# Patient Record
Sex: Male | Born: 1960 | Race: White | Hispanic: No | Marital: Married | Smoking: Never smoker
Health system: Southern US, Community
[De-identification: ages and names within clinical notes are randomized; demographics above are authoritative.]

## PROBLEM LIST (undated history)

## (undated) DIAGNOSIS — F329 Major depressive disorder, single episode, unspecified: Secondary | ICD-10-CM

## (undated) DIAGNOSIS — E7800 Pure hypercholesterolemia, unspecified: Secondary | ICD-10-CM

## (undated) HISTORY — PX: TONSILLECTOMY: SUR1361

## (undated) HISTORY — DX: Major depressive disorder, single episode, unspecified: F32.9

## (undated) HISTORY — DX: Pure hypercholesterolemia, unspecified: E78.00

---

## 2010-08-10 ENCOUNTER — Ambulatory Visit: Payer: Self-pay | Admitting: Cardiology

## 2010-08-17 ENCOUNTER — Ambulatory Visit: Payer: Self-pay | Admitting: Cardiology

## 2011-01-04 ENCOUNTER — Encounter: Payer: Self-pay | Admitting: Cardiology

## 2011-01-04 DIAGNOSIS — E7800 Pure hypercholesterolemia, unspecified: Secondary | ICD-10-CM | POA: Insufficient documentation

## 2011-01-04 DIAGNOSIS — E559 Vitamin D deficiency, unspecified: Secondary | ICD-10-CM | POA: Insufficient documentation

## 2011-02-28 ENCOUNTER — Encounter: Payer: Self-pay | Admitting: Cardiology

## 2011-02-28 NOTE — Progress Notes (Unsigned)
   Patient ID: Bruce [name], male    DOB: 05/12/1961, 50 y.o.   MRN: [id number]  HPI    Review of Systems  Constitutional: Negative.  Negative for fever, chills, diaphoresis, activity change, appetite change and fatigue.  HENT: Negative for neck pain.   Eyes: Negative for visual disturbance.  Respiratory: Negative for apnea, cough, chest tightness, shortness of breath and wheezing.   Cardiovascular: Negative.  Negative for chest pain, palpitations and leg swelling.  Gastrointestinal: Negative for nausea, abdominal pain, diarrhea, constipation and blood in stool.  Genitourinary: Negative for dysuria and frequency.  Musculoskeletal: Negative for myalgias, back pain, joint swelling, arthralgias and gait problem.  Skin: Negative for color change, pallor and rash.  Neurological: Negative for dizziness, syncope, weakness, light-headedness, numbness and headaches.  Hematological: Does not bruise/bleed easily.  Psychiatric/Behavioral: Negative for confusion and sleep disturbance. The patient is not nervous/anxious.       Physical Exam  Constitutional: He appears well-developed. No distress.  HENT:  Head: Normocephalic.  Mouth/Throat: Oropharynx is clear and moist.  Eyes: Conjunctivae and EOM are normal. Pupils are equal, round, and reactive to light.  Neck: No JVD present. Carotid bruit is not present. No mass and no thyromegaly present.  Cardiovascular: Regular rhythm, normal heart sounds and normal pulses.  PMI is not displaced.  Exam reveals no gallop.   No murmur heard. Pulmonary/Chest: Effort normal and breath sounds normal.  Abdominal: Soft. Bowel sounds are normal. There is no hepatosplenomegaly. There is no tenderness.

## 2011-02-28 NOTE — Progress Notes (Deleted)
HPI  Allergies no known allergies  Current Outpatient Prescriptions on File Prior to Visit  Medication Sig Dispense Refill  . Cholecalciferol (VITAMIN D) 2000 UNIT CAPS Take by mouth.        . fish oil-omega-3 fatty acids 1000 MG capsule Take 2 g by mouth daily.        [name] omega-3 acid ethyl esters (LOVAZA) 1 GM capsule Take 2 g by mouth daily.        . pravastatin (PRAVACHOL) 20 MG tablet Take 20 mg by mouth daily.          Past Medical History  Diagnosis Date  . Hypercholesteremia   . Vitamin D deficiency     Past Surgical History  Procedure Date  . Tonsillectomy     Family History  Problem Relation Age of Onset  . Heart attack Father     History   Social History  . Marital Status: Legally Separated    Spouse Name: N/A    Number of Children: N/A  . Years of Education: N/A   Occupational History  . Not on file.   Social History Main Topics  . Smoking status: Never Smoker   . Smokeless tobacco: Not on file  . Alcohol Use: No  . Drug Use:   . Sexually Active:    Other Topics Concern  . Not on file   Social History Narrative  . No narrative on file    ROS   PHYSICAL EXAM There were no vitals taken for this visit.  ASSESSMENT AND PLAN   This encounter was created in error - please disregard.

## 2011-03-02 NOTE — Progress Notes (Signed)
error 

## 2011-05-18 ENCOUNTER — Other Ambulatory Visit: Payer: Self-pay | Admitting: Cardiovascular Disease

## 2011-05-21 ENCOUNTER — Other Ambulatory Visit: Payer: Self-pay | Admitting: *Deleted

## 2011-05-21 DIAGNOSIS — E785 Hyperlipidemia, unspecified: Secondary | ICD-10-CM

## 2011-05-21 MED ORDER — PRAVASTATIN SODIUM 20 MG PO TABS: 20.0000 mg | ORAL_TABLET | Freq: Every day | ORAL | Status: DC

## 2011-05-21 NOTE — Telephone Encounter (Signed)
Fax received from pharmacy. Refill completed 60 days. Unable to leave msg for pt left msg on script lable for fasting labs and ov.Alfonso Ramus RN

## 2011-05-22 ENCOUNTER — Encounter: Payer: Self-pay | Admitting: Cardiology

## 2011-06-27 ENCOUNTER — Ambulatory Visit: Payer: Self-pay | Admitting: Cardiology

## 2011-06-27 ENCOUNTER — Other Ambulatory Visit: Payer: Self-pay | Admitting: *Deleted

## 2011-08-19 ENCOUNTER — Other Ambulatory Visit: Payer: Self-pay | Admitting: Cardiovascular Disease

## 2011-08-20 ENCOUNTER — Other Ambulatory Visit: Payer: Self-pay | Admitting: *Deleted

## 2011-08-20 NOTE — Telephone Encounter (Signed)
escribe medication per fax request  

## 2011-08-21 ENCOUNTER — Ambulatory Visit: Payer: Self-pay | Admitting: Cardiology

## 2011-08-21 ENCOUNTER — Other Ambulatory Visit: Payer: Self-pay | Admitting: *Deleted

## 2011-10-04 ENCOUNTER — Other Ambulatory Visit: Payer: Self-pay | Admitting: Cardiovascular Disease

## 2011-10-05 ENCOUNTER — Other Ambulatory Visit: Payer: Self-pay | Admitting: Cardiovascular Disease

## 2011-10-08 ENCOUNTER — Other Ambulatory Visit: Payer: Self-pay | Admitting: Cardiology

## 2011-10-08 MED ORDER — OMEGA-3-ACID ETHYL ESTERS 1 G PO CAPS: ORAL_CAPSULE | ORAL | Status: DC

## 2011-10-08 NOTE — Telephone Encounter (Signed)
Called requesting refill on Lovaza. Has an app in Dec but doesn't have enough meds until then. Sent refill in to [facility]

## 2011-10-08 NOTE — Telephone Encounter (Signed)
Pt had called about lovaza He said that he only got enough for 30 days, but he has appt on dec 20 to see dr [place]. He takes 2 a day and wont have enough to last until then please call him back

## 2011-11-23 ENCOUNTER — Other Ambulatory Visit (INDEPENDENT_AMBULATORY_CARE_PROVIDER_SITE_OTHER): Payer: BC Managed Care – PPO | Admitting: *Deleted

## 2011-11-23 DIAGNOSIS — E785 Hyperlipidemia, unspecified: Secondary | ICD-10-CM

## 2011-11-23 LAB — HEPATIC FUNCTION PANEL
ALT: 32 U/L (ref 0–53)
Alkaline Phosphatase: 49 U/L (ref 39–117)
Bilirubin, Direct: 0.2 mg/dL (ref 0.0–0.3)
Total Bilirubin: 1 mg/dL (ref 0.3–1.2)

## 2011-11-23 LAB — LIPID PANEL: VLDL: 27.6 mg/dL (ref 0.0–40.0)

## 2011-11-23 LAB — BASIC METABOLIC PANEL
BUN: 22 mg/dL (ref 6–23)
Creatinine, Ser: 1.1 mg/dL (ref 0.4–1.5)
GFR: 77.1 mL/min (ref >60.00)
Glucose, Bld: 101 mg/dL — ABNORMAL HIGH (ref 70–99)
Potassium: 4.3 mEq/L (ref 3.5–5.1)

## 2011-11-26 ENCOUNTER — Other Ambulatory Visit: Payer: Self-pay | Admitting: *Deleted

## 2011-11-29 ENCOUNTER — Encounter: Payer: Self-pay | Admitting: Cardiology

## 2011-11-29 ENCOUNTER — Other Ambulatory Visit: Payer: Self-pay | Admitting: *Deleted

## 2011-11-29 ENCOUNTER — Ambulatory Visit (INDEPENDENT_AMBULATORY_CARE_PROVIDER_SITE_OTHER): Payer: BC Managed Care – PPO | Admitting: Cardiology

## 2011-11-29 VITALS — BP 118/82 | HR 70 | Ht 71.0 in | Wt 189.8 lb

## 2011-11-29 DIAGNOSIS — E785 Hyperlipidemia, unspecified: Secondary | ICD-10-CM

## 2011-11-29 DIAGNOSIS — E7800 Pure hypercholesterolemia, unspecified: Secondary | ICD-10-CM

## 2011-11-29 MED ORDER — PRAVASTATIN SODIUM 20 MG PO TABS: 20.0000 mg | ORAL_TABLET | Freq: Every day | ORAL | Status: DC

## 2011-11-29 MED ORDER — OMEGA-3-ACID ETHYL ESTERS 1 G PO CAPS: ORAL_CAPSULE | ORAL | Status: DC

## 2011-11-29 NOTE — Patient Instructions (Signed)
You need to increase your aerobic activity.  Continue your current medication.  I will see you again in 1 year with fasting lab work.

## 2011-11-29 NOTE — Assessment & Plan Note (Signed)
His lipid levels are improved. He will remain on his current therapy. I've encouraged him to increase his aerobic activity and lose weight. I'll followup again in one year with fasting lab work. Prescriptions were refilled today.

## 2011-11-29 NOTE — Progress Notes (Signed)
   [name] Date of Birth: [birth year] Medical Record #[id number]  History of Present Illness: Bruce [name] is seen for yearly followup. He reports that he has been doing well this year. He denies any new medical problems. He is on the same medications. He admits that he is not exercising. His weight has increased about 12 pounds. He has a history of hypercholesterolemia. He also is a family history of early coronary disease. He denies any chest pain or shortness of breath.  Current Outpatient Prescriptions on File Prior to Visit  Medication Sig Dispense Refill  . Cholecalciferol (VITAMIN D) 2000 UNIT CAPS Take by mouth.          No Known Allergies  Past Medical History  Diagnosis Date  . Hypercholesteremia   . Vitamin D deficiency     Past Surgical History  Procedure Date  . Tonsillectomy     History  Smoking status  . Never Smoker   Smokeless tobacco  . Not on file    History  Alcohol Use No    Family History  Problem Relation Age of Onset  . Heart attack Father     Review of Systems: As noted in history of present illness.  All other systems were reviewed and are negative.  Physical Exam: BP 118/82  Pulse 70  Ht 5\' 11"  (1.803 m)  Wt 189 lb 12.8 oz (86.093 kg)  BMI 26.47 kg/m2 The patient is alert and oriented x 3.  The mood and affect are normal.  The skin is warm and dry.  Color is normal.  The HEENT exam reveals that the sclera are nonicteric.  The mucous membranes are moist.  The carotids are 2+ without bruits.  There is no thyromegaly.  There is no JVD.  The lungs are clear.  The chest wall is non tender.  The heart exam reveals a regular rate with a normal S1 and S2.  There are no murmurs, gallops, or rubs.  The PMI is not displaced.   Abdominal exam reveals good bowel sounds.  There is no guarding or rebound.  There is no hepatosplenomegaly or tenderness.  There are no masses.  Exam of the legs reveal no clubbing, cyanosis, or edema.  The legs are without rashes.   The distal pulses are intact.  Cranial nerves II - XII are intact.  Motor and sensory functions are intact.  The gait is normal.  LABORATORY DATA: Lab Results  Component Value Date   GLUCOSE 101* 11/23/2011   CHOL 156 11/23/2011   TRIG 138.0 11/23/2011   HDL 44.70 11/23/2011   LDLCALC 84 11/23/2011   ALT 32 11/23/2011   AST 33 11/23/2011   NA 141 11/23/2011   K 4.3 11/23/2011   CL 105 11/23/2011   CREATININE 1.1 11/23/2011   BUN 22 11/23/2011   CO2 29 11/23/2011   Lab Results  Component Value Date   CHOL 156 11/23/2011   HDL 44.70 11/23/2011   LDLCALC 84 11/23/2011   TRIG 138.0 11/23/2011   CHOLHDL 3 11/23/2011   ECG today is normal.  Assessment / Plan:

## 2011-12-03 ENCOUNTER — Other Ambulatory Visit: Payer: Self-pay | Admitting: Cardiovascular Disease

## 2012-11-12 ENCOUNTER — Encounter: Payer: Self-pay | Admitting: Cardiology

## 2012-11-12 ENCOUNTER — Ambulatory Visit (INDEPENDENT_AMBULATORY_CARE_PROVIDER_SITE_OTHER): Payer: BC Managed Care – PPO | Admitting: Cardiology

## 2012-11-12 ENCOUNTER — Other Ambulatory Visit (INDEPENDENT_AMBULATORY_CARE_PROVIDER_SITE_OTHER): Payer: BC Managed Care – PPO

## 2012-11-12 VITALS — BP 137/91 | HR 102 | Ht 71.0 in | Wt 189.1 lb

## 2012-11-12 DIAGNOSIS — E7800 Pure hypercholesterolemia, unspecified: Secondary | ICD-10-CM

## 2012-11-12 DIAGNOSIS — E785 Hyperlipidemia, unspecified: Secondary | ICD-10-CM

## 2012-11-12 LAB — BASIC METABOLIC PANEL
Chloride: 100 mEq/L (ref 96–112)
Creatinine, Ser: 1 mg/dL (ref 0.4–1.5)
Potassium: 4.2 mEq/L (ref 3.5–5.1)

## 2012-11-12 LAB — HEPATIC FUNCTION PANEL
ALT: 34 U/L (ref 0–53)
Bilirubin, Direct: 0.1 mg/dL (ref 0.0–0.3)
Total Bilirubin: 0.7 mg/dL (ref 0.3–1.2)

## 2012-11-12 LAB — LIPID PANEL
Cholesterol: 228 mg/dL — ABNORMAL HIGH (ref 0–200)
Total CHOL/HDL Ratio: 6
VLDL: 63 mg/dL — ABNORMAL HIGH (ref 0.0–40.0)

## 2012-11-12 NOTE — Progress Notes (Signed)
Bruce [name] Date of Birth: 1961-02-03 Medical Record #[id number]  History of Present Illness: Bruce [name] is seen for yearly followup of hypercholesterolemia. He had previously been on pravastatin. He states he was having a lot of complaints of extreme fatigue at the end of the day. He was also awakening at night with his thoughts racing and was unable to get back to sleep. He had periods of feeling overwhelmed when he was in stressful situations at the office. He had decreased libido. He stop taking his pravastatin and he states that the symptoms are better. He still has some fatigue but it is improved. He denies any chest pain or shortness of breath.  No current outpatient prescriptions on file prior to visit.    No Known Allergies  Past Medical History  Diagnosis Date  . Hypercholesteremia   . Vitamin D deficiency     Past Surgical History  Procedure Date  . Tonsillectomy     History  Smoking status  . Never Smoker   Smokeless tobacco  . Not on file    History  Alcohol Use No    Family History  Problem Relation Age of Onset  . Heart attack Father     Review of Systems: As noted in history of present illness.  All other systems were reviewed and are negative.  Physical Exam: BP 137/91  Pulse 102  Ht 5\' 11"  (1.803 m)  Wt 189 lb 1.9 oz (85.784 kg)  BMI 26.38 kg/m2 The patient is alert and oriented x 3.  The mood and affect are normal.  The skin is warm and dry.    The HEENT exam is normal. The carotids are 2+ without bruits.  There is no thyromegaly.  There is no JVD.  The lungs are clear.  The heart exam reveals a regular rate with a normal S1 and S2.  There are no murmurs, gallops, or rubs.  The PMI is not displaced.   Abdominal exam reveals good bowel sounds.  There is no guarding or rebound.  There is no hepatosplenomegaly or tenderness.  There are no masses.  Exam of the legs reveal no clubbing, cyanosis, or edema.  The legs are without rashes.  The distal pulses are  intact.  Cranial nerves II - XII are intact.  Motor and sensory functions are intact.  The gait is normal.  LABORATORY DATA: Lab Results  Component Value Date   GLUCOSE 101* 11/12/2012   CHOL 228* 11/12/2012   TRIG 315.0* 11/12/2012   HDL 39.10 11/12/2012   LDLDIRECT 135.0 11/12/2012   LDLCALC 84 11/23/2011   ALT 34 11/12/2012   AST 33 11/12/2012   NA 138 11/12/2012   K 4.2 11/12/2012   CL 100 11/12/2012   CREATININE 1.0 11/12/2012   BUN 20 11/12/2012   CO2 30 11/12/2012   Lab Results  Component Value Date   CHOL 228* 11/12/2012   HDL 39.10 11/12/2012   LDLCALC 84 11/23/2011   LDLDIRECT 135.0 11/12/2012   TRIG 315.0* 11/12/2012   CHOLHDL 6 11/12/2012     Assessment / Plan: 1. Hypercholesterolemia. Patient is being treated for primary prevention. He has no known history of vascular disease. He has no other risk factors other than family history of coronary disease. Given his apparent reaction to pravastatin I would recommend lifestyle modifications including weight loss and regular aerobic exercise along with a Mediterranean diet. I would not place him back on statin therapy at this point. I'll followup again in one year.

## 2012-11-12 NOTE — Patient Instructions (Addendum)
We will call with the results of your lab work today.

## 2013-02-16 ENCOUNTER — Other Ambulatory Visit: Payer: Self-pay | Admitting: *Deleted

## 2013-02-17 ENCOUNTER — Other Ambulatory Visit: Payer: Self-pay | Admitting: Cardiology

## 2013-02-19 NOTE — Telephone Encounter (Signed)
Spoke to patient he stated he takes Lovaza 1 Gm 2 tablets every day.Refill sent to pharmacy.

## 2014-02-26 ENCOUNTER — Ambulatory Visit: Payer: BC Managed Care – PPO | Admitting: Cardiology

## 2014-06-04 ENCOUNTER — Ambulatory Visit: Payer: BC Managed Care – PPO | Admitting: Cardiology

## 2014-09-01 ENCOUNTER — Telehealth: Payer: Self-pay | Admitting: Cardiology

## 2014-09-01 DIAGNOSIS — E7800 Pure hypercholesterolemia, unspecified: Secondary | ICD-10-CM

## 2014-09-01 NOTE — Telephone Encounter (Signed)
New message          Pt would like to know if he needs blood work before his f/u visit

## 2014-09-03 NOTE — Telephone Encounter (Signed)
Returned call to patient may have fasting lab before appointment.Lab order mailed to patient.

## 2014-11-02 ENCOUNTER — Ambulatory Visit: Payer: BC Managed Care – PPO | Admitting: Cardiology

## 2015-11-25 ENCOUNTER — Telehealth: Payer: Self-pay | Admitting: Family Medicine

## 2015-11-25 NOTE — Telephone Encounter (Signed)
Pts wife called in stating they would prefer to see Dr. Laury AxonLowne. She is going to discuss options with the pt. He is changing over from another doctor who is just not happy with. They prefer to take a more holistic approach to treat a potential thyroid issue and move on to next steps. She will call after speaking with the pt.

## 2015-11-25 NOTE — Telephone Encounter (Signed)
REceived call from pts mother stating that he is always cold and doesn't sleep well, she feels concern of thyroid issue, offered to schedule acute appt with Cody (30 min) for next week, pt mother said to please call him, called pt primary contact # and left msg for him to call and schedule appt if he would like (new pt in March 2017 with Dr. Laury AxonLowne)

## 2015-11-28 ENCOUNTER — Telehealth: Payer: Self-pay | Admitting: Family Medicine

## 2015-11-28 ENCOUNTER — Encounter: Payer: Self-pay | Admitting: Medical

## 2015-11-28 ENCOUNTER — Ambulatory Visit (INDEPENDENT_AMBULATORY_CARE_PROVIDER_SITE_OTHER): Payer: BLUE CROSS/BLUE SHIELD | Admitting: Medical

## 2015-11-28 VITALS — BP 118/80 | HR 77 | Temp 97.6°F | Ht 71.0 in | Wt 187.2 lb

## 2015-11-28 DIAGNOSIS — R6882 Decreased libido: Secondary | ICD-10-CM

## 2015-11-28 DIAGNOSIS — N529 Male erectile dysfunction, unspecified: Secondary | ICD-10-CM | POA: Insufficient documentation

## 2015-11-28 DIAGNOSIS — R0683 Snoring: Secondary | ICD-10-CM | POA: Insufficient documentation

## 2015-11-28 DIAGNOSIS — R946 Abnormal results of thyroid function studies: Secondary | ICD-10-CM

## 2015-11-28 DIAGNOSIS — E559 Vitamin D deficiency, unspecified: Secondary | ICD-10-CM

## 2015-11-28 DIAGNOSIS — E785 Hyperlipidemia, unspecified: Secondary | ICD-10-CM

## 2015-11-28 DIAGNOSIS — R5383 Other fatigue: Secondary | ICD-10-CM

## 2015-11-28 DIAGNOSIS — E7800 Pure hypercholesterolemia, unspecified: Secondary | ICD-10-CM

## 2015-11-28 DIAGNOSIS — R7989 Other specified abnormal findings of blood chemistry: Secondary | ICD-10-CM

## 2015-11-28 DIAGNOSIS — F411 Generalized anxiety disorder: Secondary | ICD-10-CM

## 2015-11-28 NOTE — Progress Notes (Signed)
Subjective:    Patient ID: Bruce [name], male    DOB: [birth year], 54 y.o.   MRN: [id number]  HPI  I have reviewed pt PMH, PSH, FH, Social History and Surgical History  Pt in past had some borderline high  lipids in past. When he sticks to low lipid diet and exercise numbers are more controlled.  Hx of low vitamin d deficiency in the past. Never took rx vitamin  D.  Pt is an Art gallery managerengineer, no exercise, recent healthy diet, 1 cup coffee a day. Married- 4 children.  Pt feels fatigue. He states has been gradual decrease in energy for 5 years. He states fatigue worst in the evening. Also he has had gradually decreasing libido in the past. Work up  About 5 years work in the past showed low vitamin D. Pt never took vitamin D and never got back to normal. He never took the vitamin D.   He also states he feels cold all the time. This getting cold aspect started 4-5 months ago.  Pt has tinnitus comes and goes. No hearing problems. No ha and no dizziness reported.  Pt states he wakes up early in morning at times. Trouble staying asleep. When he wakes up he has some anxiety and racing thoughts.  Anxiety starts around 4:30 am when he awakes. By early afternoon anxiety resolved. Recently pessimstic and not motivated. Feels maybe depressed little bit. Appetite decreased.  Pt did have some labs tsh ws normal. TPO was little high.  Prior MD thought maybe sleep study since he snores. Also recommended antidepressant.        Review of Systems  Constitutional: Positive for fatigue. Negative for fever and chills.  HENT: Positive for tinnitus. Negative for congestion, ear discharge, facial swelling, postnasal drip, rhinorrhea, sinus pressure and sneezing.   Respiratory: Negative for apnea.   Cardiovascular: Negative for chest pain and palpitations.  Endocrine: Positive for cold intolerance.  Skin: Negative for rash.  Neurological: Negative for dizziness and headaches.  Psychiatric/Behavioral:  Positive for sleep disturbance and dysphoric mood. Negative for behavioral problems and confusion. The patient is nervous/anxious.        See hpi.    Past Medical History  Diagnosis Date  . Hypercholesteremia   . Vitamin D deficiency     Social History   Social History  . Marital Status: Married    Spouse Name: N/A  . Number of Children: 4  . Years of Education: N/A   Occupational History  . engineer    Social History Main Topics  . Smoking status: Never Smoker   . Smokeless tobacco: Not on file  . Alcohol Use: No  . Drug Use: No  . Sexual Activity: Yes   Other Topics Concern  . Not on file   Social History Narrative    Past Surgical History  Procedure Laterality Date  . Tonsillectomy      Family History  Problem Relation Age of Onset  . Heart attack Father   . Hypothyroidism Mother     No Known Allergies  No current outpatient prescriptions on file prior to visit.   No current facility-administered medications on file prior to visit.    BP 118/80 mmHg  Pulse 77  Temp(Src) 97.6 F (36.4 C) (Oral)  Ht 5\' 11"  (1.803 m)  Wt 187 lb 3.2 oz (84.913 kg)  BMI 26.12 kg/m2  SpO2 98%       Objective:   Physical Exam  General Mental Status- Alert. General Appearance- Not  in acute distress.   Skin General: Color- Normal Color. Moisture- Normal Moisture.  Neck Carotid Arteries- Normal color. Moisture- Normal Moisture.no thryromegaly.  Chest and Lung Exam Auscultation: Breath Sounds:-Normal.  Cardiovascular Auscultation:Rythm- Regular. Murmurs & Other Heart Sounds:Auscultation of the heart reveals- No Murmurs.  Abdomen Inspection:-Inspeection Normal. Palpation/Percussion:Note:No mass. Palpation and Percussion of the abdomen reveal- Non Tender, Non Distended + BS, no rebound or guarding.  Neurologic Cranial Nerve exam:- CN III-XII intact(No nystagmus), symmetric smile     Assessment & Plan:  For your  Fatigue and other signs  today, will  get lab tomorrow am.  I referred you to endocrinologist for your fatigue and elevated TPO studies.  I referred you to pulmonologist for snoring as this may effect your energy levels if O2 sats are dropping less than 90% while sleeping.  Consider use of antidepressant medication if labs and referrals are negative.  Follow up in 2-3 weeks or as needed

## 2015-11-28 NOTE — Progress Notes (Signed)
Pre visit review using our clinic review tool, if applicable. No additional management support is needed unless otherwise documented below in the visit note. 

## 2015-11-28 NOTE — Assessment & Plan Note (Signed)
Lipid panel check fasting tomorrow am.

## 2015-11-28 NOTE — Assessment & Plan Note (Signed)
Get testosterone tomorrow morning. If low tsh will plan to get psa/dre  in near future. But refer to endocrinologist for possible testosterone replacement.

## 2015-11-28 NOTE — Telephone Encounter (Signed)
At check out pt had another question for provider that he seen.   Per provider he will fu with pt at his earliest convenience .   CB: [phone]

## 2015-11-28 NOTE — Assessment & Plan Note (Signed)
Refer to evaluation and possible sleep study.

## 2015-11-28 NOTE — Assessment & Plan Note (Signed)
Labs tomorrow. Refer to endocrine for his high tpo test but normal tsh and t4.

## 2015-11-28 NOTE — Telephone Encounter (Signed)
Left message for pt to call back  °

## 2015-11-28 NOTE — Assessment & Plan Note (Signed)
Will get level tomorrow when in for labs.

## 2015-11-28 NOTE — Telephone Encounter (Signed)
I put in another referral with different diagnosis. Endocrine stated they would not see him for fatigue.

## 2015-11-28 NOTE — Assessment & Plan Note (Signed)
First time here. But I think he may have some low level chronic anxiety and mild depression. If lab work up for cause off fatigue would consider trial of effexor.

## 2015-11-28 NOTE — Telephone Encounter (Signed)
Pt wanted me to add thyroid studies. I will do that at his request. But I did not order this yesterday since he just had this done on 11-22-2015. Since so recent thought maybe unnecessary and did not want him to have issue with insurance. But I did place order in at his request. If you mention to him when he is in for lab in am.

## 2015-11-28 NOTE — Telephone Encounter (Signed)
Caller name: Self   Can be reached: [phone]   Reason for call: Mervyn SkeetersSaw Edward this morning and will be coming back tomorrow for labs. Wants to have Thyroid Panel added to the labs for tomorrow

## 2015-11-28 NOTE — Patient Instructions (Addendum)
For your  Fatigue and other symptoms today, will get lab tomorrow am.  I referred you to endocrinologist for your fatigue and elevated TPO studies.  I referred you to pulmonologist for snoring as this may effect your energy levels if O2 sats are dropping less than 90% while sleeping.  Consider use of antidepressant medication if labs and referrals are negative.  Follow up in 2-3 weeks or as needed

## 2015-11-28 NOTE — Telephone Encounter (Signed)
At visit pt gave provider Ambulance person(Saguier) some labs results  [facility].   Made copies for provider and forwarded to CMA for paperwork.

## 2015-11-29 ENCOUNTER — Other Ambulatory Visit (INDEPENDENT_AMBULATORY_CARE_PROVIDER_SITE_OTHER): Payer: BLUE CROSS/BLUE SHIELD

## 2015-11-29 DIAGNOSIS — E785 Hyperlipidemia, unspecified: Secondary | ICD-10-CM | POA: Diagnosis not present

## 2015-11-29 DIAGNOSIS — R6882 Decreased libido: Secondary | ICD-10-CM

## 2015-11-29 DIAGNOSIS — R5383 Other fatigue: Secondary | ICD-10-CM

## 2015-11-29 DIAGNOSIS — R7989 Other specified abnormal findings of blood chemistry: Secondary | ICD-10-CM

## 2015-11-29 LAB — T4, FREE: FREE T4: 0.72 ng/dL (ref 0.60–1.60)

## 2015-11-29 LAB — CBC WITH DIFFERENTIAL/PLATELET
BASOS PCT: 0.6 % (ref 0.0–3.0)
Basophils Absolute: 0 10*3/uL (ref 0.0–0.1)
EOS ABS: 0.2 10*3/uL (ref 0.0–0.7)
Eosinophils Relative: 2.6 % (ref 0.0–5.0)
HCT: 45.7 % (ref 39.0–52.0)
HEMOGLOBIN: 15 g/dL (ref 13.0–17.0)
LYMPHS ABS: 2.4 10*3/uL (ref 0.7–4.0)
Lymphocytes Relative: 36.4 % (ref 12.0–46.0)
MCHC: 32.8 g/dL (ref 30.0–36.0)
MCV: 74.2 fl — ABNORMAL LOW (ref 78.0–100.0)
MONO ABS: 0.5 10*3/uL (ref 0.1–1.0)
Monocytes Relative: 7.3 % (ref 3.0–12.0)
NEUTROS PCT: 53.1 % (ref 43.0–77.0)
Neutro Abs: 3.5 10*3/uL (ref 1.4–7.7)
PLATELETS: 272 10*3/uL (ref 150.0–400.0)
RBC: 6.15 Mil/uL — ABNORMAL HIGH (ref 4.22–5.81)
RDW: 14 % (ref 11.5–15.5)
WBC: 6.7 10*3/uL (ref 4.0–10.5)

## 2015-11-29 LAB — COMPREHENSIVE METABOLIC PANEL
ALT: 19 U/L (ref 0–53)
AST: 21 U/L (ref 0–37)
Albumin: 4.7 g/dL (ref 3.5–5.2)
Alkaline Phosphatase: 50 U/L (ref 39–117)
BUN: 21 mg/dL (ref 6–23)
CHLORIDE: 102 meq/L (ref 96–112)
CO2: 30 meq/L (ref 19–32)
CREATININE: 1.03 mg/dL (ref 0.40–1.50)
Calcium: 9.9 mg/dL (ref 8.4–10.5)
GFR: 80.15 mL/min (ref >60.00)
GLUCOSE: 102 mg/dL — AB (ref 70–99)
Potassium: 4 mEq/L (ref 3.5–5.1)
SODIUM: 140 meq/L (ref 135–145)
Total Bilirubin: 0.8 mg/dL (ref 0.2–1.2)
Total Protein: 7.5 g/dL (ref 6.0–8.3)

## 2015-11-29 LAB — LIPID PANEL
CHOL/HDL RATIO: 6
CHOLESTEROL: 232 mg/dL — AB (ref 0–200)
HDL: 42.1 mg/dL (ref >39.00)
NonHDL: 189.53
TRIGLYCERIDES: 220 mg/dL — AB (ref 0.0–149.0)
VLDL: 44 mg/dL — AB (ref 0.0–40.0)

## 2015-11-29 LAB — LDL CHOLESTEROL, DIRECT: LDL DIRECT: 146 mg/dL

## 2015-11-29 LAB — VITAMIN D 25 HYDROXY (VIT D DEFICIENCY, FRACTURES): VITD: 20.39 ng/mL — ABNORMAL LOW (ref 30.00–100.00)

## 2015-11-29 LAB — TSH: TSH: 2.54 u[IU]/mL (ref 0.35–4.50)

## 2015-11-30 ENCOUNTER — Telehealth: Payer: Self-pay | Admitting: Medical

## 2015-11-30 DIAGNOSIS — Z125 Encounter for screening for malignant neoplasm of prostate: Secondary | ICD-10-CM

## 2015-11-30 LAB — TESTOSTERONE, FREE, TOTAL, SHBG
Sex Hormone Binding: 18 nmol/L (ref 10–50)
Testosterone, Free: 68.4 pg/mL (ref 47.0–244.0)
Testosterone-% Free: 2.6 % (ref 1.6–2.9)
Testosterone: 260 ng/dL — ABNORMAL LOW (ref 300–890)

## 2015-11-30 LAB — THYROID PEROXIDASE ANTIBODY: THYROID PEROXIDASE ANTIBODY: 80 [IU]/mL — AB (ref <9)

## 2015-11-30 NOTE — Telephone Encounter (Signed)
Would you mind asking why? So I can explain to patient? Have we had any success in referring to other office. Has this pt been seen by this office before?

## 2015-11-30 NOTE — Telephone Encounter (Signed)
Note sent to Wadie Lessenavid Reabold in lab to see if we could add psa to test done other day.

## 2015-12-01 NOTE — Telephone Encounter (Signed)
Are you speaking of endocrinology referral? If so patient was declined due to dx

## 2015-12-02 ENCOUNTER — Telehealth: Payer: Self-pay | Admitting: Medical

## 2015-12-02 DIAGNOSIS — R7989 Other specified abnormal findings of blood chemistry: Secondary | ICD-10-CM

## 2015-12-02 MED ORDER — VITAMIN D (ERGOCALCIFEROL) 1.25 MG (50000 UNIT) PO CAPS: 50000.0000 [IU] | ORAL_CAPSULE | ORAL | Status: DC

## 2015-12-02 NOTE — Telephone Encounter (Signed)
Called phone number [phone]. Left message that I want to talk with him directly. Will try to call around 5:30 pm.

## 2015-12-02 NOTE — Telephone Encounter (Signed)
I advised pt on tpo, tsh and t4 levels. That I am trying to get him in with endocrinologist Dr. Talmage NapBalan. Advised on low vitamin d. He is willing to take rx vitamin d and get repeat level in 8 wks.  Advised on lipid. Made aware markers high. He states pravastatin put him in mental fog. He want to try strict diet, exercise and fishoil. Will repeat lipid panel in 3 months fasting.  Advised pt on low testosterone. Advised on beneftit vs risk. He want to get endocrinologist opinion.   Asked him to follow up in 6 wks hopefully afer we get endocrine opinion.

## 2015-12-02 NOTE — Telephone Encounter (Signed)
Reminder on Dr. Talmage NapBalan. On know you put up your reminder. But thought I would send one as well.

## 2015-12-06 ENCOUNTER — Telehealth: Payer: Self-pay | Admitting: Medical

## 2015-12-06 NOTE — Telephone Encounter (Signed)
psa added to labs but not done. Intend to order that on follow up when he is in office.

## 2015-12-07 NOTE — Telephone Encounter (Signed)
Records were faxed to Dr Talmage NapBalan on 12/01/15, awaiting appt

## 2015-12-20 ENCOUNTER — Ambulatory Visit: Payer: BLUE CROSS/BLUE SHIELD | Admitting: Medical

## 2015-12-27 ENCOUNTER — Encounter: Payer: Self-pay | Admitting: Medical

## 2015-12-27 ENCOUNTER — Ambulatory Visit (INDEPENDENT_AMBULATORY_CARE_PROVIDER_SITE_OTHER): Payer: BLUE CROSS/BLUE SHIELD | Admitting: Medical

## 2015-12-27 VITALS — BP 110/76 | HR 60 | Temp 97.7°F | Ht 71.0 in | Wt 182.2 lb

## 2015-12-27 DIAGNOSIS — E039 Hypothyroidism, unspecified: Secondary | ICD-10-CM | POA: Diagnosis not present

## 2015-12-27 DIAGNOSIS — R7989 Other specified abnormal findings of blood chemistry: Secondary | ICD-10-CM | POA: Diagnosis not present

## 2015-12-27 DIAGNOSIS — Z125 Encounter for screening for malignant neoplasm of prostate: Secondary | ICD-10-CM

## 2015-12-27 DIAGNOSIS — Z1211 Encounter for screening for malignant neoplasm of colon: Secondary | ICD-10-CM | POA: Diagnosis not present

## 2015-12-27 NOTE — Patient Instructions (Addendum)
I am glad you got in with endocrinologist and have seen some improvement with your fatigue and other symptoms on low dose thyroid med.  For high cholesterol continue diet exercise and fish oil. Repeat lipid panel in about 3 months.  For your low vitamin d will put future order to repeat level in 4 weeks(continue vitamin d tablet). Also that day get psa for prostate screening.  Follow up date to be determined after those lab levels are back. Follow up may be in 2-3 months.  Please have Dr. Talmage Nap send visit note and lab results.

## 2015-12-27 NOTE — Progress Notes (Signed)
Subjective:    Patient ID: Bruce [name], male    DOB: June 14, 1961, 55 y.o.   MRN: [id number]  HPI  Pt here for follow up on fatigue. Pt has history of positive high thyroid peroxidase antibodies. I attempted to refer to endocrinologist but  Initial endocrinologist would not take referral for this and fatigue. Eventually pt seen by Dr Talmage Nap.  Pt states Dr. Talmage Nap will recheck tsh in 6 weeks.   Dr. Talmage Nap  started  Patient on low dose thyroid medication. Pt states  yesterday chills sensation did decrease. He has more energy and less anxiety.  Pt testosterone was low on last labs and pt did not want to pursue supplementation.   Pt has high lipids. Pt states pravastatin put him in mental fog. He  decided to diet and exercise. Then repeat lipid panel after 3 months. Pt admits to eating less cholesterol. He will start treadmill. Pt states will also try fish oil.  Pt had low vitamin D. He did agree to vitamin  d supplementation. Currently taking rx for vitamin D.       Review of Systems  Constitutional: Positive for fatigue. Negative for fever and chills.       Pt energy level is improving.  Some slight increase in appetite after 6 days of thyroid medication.  Respiratory: Negative for cough, chest tightness and wheezing.   Cardiovascular: Negative for chest pain and palpitations.  Endocrine: Positive for cold intolerance.       This has been improving.  Musculoskeletal: Negative for back pain.  Skin: Negative for rash.  Neurological: Negative for dizziness and headaches.  Hematological: Negative for adenopathy. Does not bruise/bleed easily.  Psychiatric/Behavioral: Negative for behavioral problems and confusion.    Past Medical History  Diagnosis Date  . Hypercholesteremia   . Vitamin D deficiency     Social History   Social History  . Marital Status: Married    Spouse Name: N/A  . Number of Children: 4  . Years of Education: N/A   Occupational History  . engineer     Social History Main Topics  . Smoking status: Never Smoker   . Smokeless tobacco: Not on file  . Alcohol Use: No  . Drug Use: No  . Sexual Activity: Yes   Other Topics Concern  . Not on file   Social History Narrative    Past Surgical History  Procedure Laterality Date  . Tonsillectomy      Family History  Problem Relation Age of Onset  . Heart attack Father   . Hypothyroidism Mother     No Known Allergies  Current Outpatient Prescriptions on File Prior to Visit  Medication Sig Dispense Refill  . Vitamin D, Ergocalciferol, (DRISDOL) 50000 UNITS CAPS capsule Take 1 capsule (50,000 Units total) by mouth every 7 (seven) days. 8 capsule 0   No current facility-administered medications on file prior to visit.    BP 110/76 mmHg  Pulse 60  Temp(Src) 97.7 F (36.5 C) (Oral)  Ht  (1.803 m)  Wt 182 lb 3.2 oz (82.645 kg)  BMI 25.42 kg/m2  SpO2 99%       Objective:   Physical Exam  General Mental Status- Alert. General Appearance- Not in acute distress.   Skin General: Color- Normal Color. Moisture- Normal Moisture.  Neck Carotid Arteries- Normal color. Moisture- Normal Moisture. No JVD. No thyromegaly.  Chest and Lung Exam Auscultation: Breath Sounds:-Normal.  Cardiovascular Auscultation:Rythm- Regular. Murmurs & Other Heart Sounds:Auscultation of the heart  reveals- No Murmurs.  Abdomen Inspection:-Inspeection Normal. Palpation/Percussion:Note:No mass. Palpation and Percussion of the abdomen reveal- Non Tender, Non Distended + BS, no rebound or guarding.    Neurologic Cranial Nerve exam:- CN III-XII intact(No nystagmus), symmetric smile. strength:- 5/5 equal and symmetric strength both upper and lower extremities.      Assessment & Plan:  I am glad you got in with endocrinologist and have seen some improvement with your fatigue and other symptoms on low dose thyroid med.  For high cholesterol continue diet exercise and fish oil. Repeat  lipid panel in about 3 months.  For your low vitamin d will put future order to repeat level in 4 weeks(continue vitamin d tablet). Also that day get psa for prostate screening.  Follow up date to be determined after those lab levels are back. Follow up may be in 2-3 months.  Please have Dr. Talmage Nap send visit note and lab results.

## 2015-12-27 NOTE — Progress Notes (Signed)
Pre visit review using our clinic review tool, if applicable. No additional management support is needed unless otherwise documented below in the visit note. 

## 2016-01-16 ENCOUNTER — Other Ambulatory Visit: Payer: Self-pay | Admitting: Medical

## 2016-01-19 ENCOUNTER — Telehealth: Payer: Self-pay | Admitting: Family Medicine

## 2016-01-19 NOTE — Telephone Encounter (Signed)
Pt states that he is having trouble sleeping and feels it may be related to thyroid. He is requesting call from provider at [phone] to discuss issues that he is having.

## 2016-01-19 NOTE — Telephone Encounter (Signed)
Edward please see note below. 

## 2016-01-20 NOTE — Telephone Encounter (Signed)
Let pt know I have received his message but fairly busy seeing patients. Will try to call him this afternoon after 5 pm. Yesterday long day working in evening and just could not call.

## 2016-01-20 NOTE — Telephone Encounter (Signed)
Left message for pt that provider will call pt back later on this afternoon to discuss further.

## 2016-01-20 NOTE — Telephone Encounter (Signed)
I did call pt back at 6:30 pm tonight.Very busy day. I explained that if he thinks thyroid disorder or med causing insomnia then touch base/leave message with endocrinologist. Depending on severity of his symptoms he could call back on Monday or tuesday or come in. Icould possibly give medication to help him sleep.

## 2016-01-25 ENCOUNTER — Telehealth: Payer: Self-pay | Admitting: Family Medicine

## 2016-01-25 NOTE — Telephone Encounter (Signed)
Caller name: Vickey Huger Relationship to patient: Wife Can be reached: [phone]   Reason for call: Wife called and asked if patient can have a referral to [facility] for low testosterone, and extreme fatique.

## 2016-01-25 NOTE — Telephone Encounter (Signed)
Pt last seen by Ramon Dredge in 12/2015, will defer message to him.

## 2016-01-25 NOTE — Telephone Encounter (Signed)
Pt is being seen by Dr. Talmage Nap for his fatigue and elevated tpo antibodies.  Other endocrinologist would not see him for those complaints. He has low testosterone. Looks like referral was made has been placed and pt can see Dr. Lucianne Muss. Which is fine. But was wondering would Dr. Talmage Nap also see him for low testosterone if we asked??

## 2016-01-25 NOTE — Telephone Encounter (Signed)
Pt wife calling and asking about referral. If made notify pt of appointment. Not sure wife is on HIPPA form.

## 2016-01-27 ENCOUNTER — Ambulatory Visit (INDEPENDENT_AMBULATORY_CARE_PROVIDER_SITE_OTHER): Payer: BLUE CROSS/BLUE SHIELD | Admitting: Medical

## 2016-01-27 ENCOUNTER — Telehealth: Payer: Self-pay | Admitting: Family Medicine

## 2016-01-27 ENCOUNTER — Encounter: Payer: Self-pay | Admitting: Medical

## 2016-01-27 VITALS — BP 132/84 | HR 81 | Temp 98.1°F | Ht 71.0 in | Wt 178.6 lb

## 2016-01-27 DIAGNOSIS — R0683 Snoring: Secondary | ICD-10-CM | POA: Diagnosis not present

## 2016-01-27 DIAGNOSIS — R7989 Other specified abnormal findings of blood chemistry: Secondary | ICD-10-CM

## 2016-01-27 DIAGNOSIS — Z125 Encounter for screening for malignant neoplasm of prostate: Secondary | ICD-10-CM

## 2016-01-27 DIAGNOSIS — R5383 Other fatigue: Secondary | ICD-10-CM | POA: Diagnosis not present

## 2016-01-27 DIAGNOSIS — T148 Other injury of unspecified body region: Secondary | ICD-10-CM

## 2016-01-27 DIAGNOSIS — R739 Hyperglycemia, unspecified: Secondary | ICD-10-CM

## 2016-01-27 DIAGNOSIS — E291 Testicular hypofunction: Secondary | ICD-10-CM

## 2016-01-27 DIAGNOSIS — W57XXXA Bitten or stung by nonvenomous insect and other nonvenomous arthropods, initial encounter: Secondary | ICD-10-CM

## 2016-01-27 DIAGNOSIS — F411 Generalized anxiety disorder: Secondary | ICD-10-CM

## 2016-01-27 LAB — HEMOGLOBIN A1C
Hgb A1c MFr Bld: 5.5 % (ref <5.7)
MEAN PLASMA GLUCOSE: 111 mg/dL (ref <117)

## 2016-01-27 MED ORDER — VENLAFAXINE HCL 25 MG PO TABS: ORAL_TABLET | ORAL | Status: DC

## 2016-01-27 MED ORDER — HYDROXYZINE PAMOATE 25 MG PO CAPS: ORAL_CAPSULE | ORAL | Status: DC

## 2016-01-27 NOTE — Progress Notes (Signed)
Subjective:    Patient ID: Bruce [name], male    DOB: January 27, 1961, 55 y.o.   MRN: [id number]  HPI  Pt in stating trouble sleeping. Recently sleeping only 2-3 hours a night. He states some anxious thoughts.Some occasional sweating. In the past was waking up at 4:30 am but recently just waking up after 2-3 hours of sleep. Then he can't go back to sleep.  He has not been eating much and fatigued.  Pt was on thyroid med low dose. Intially he felt better with med then after short time he felt anxious and uncomfortable. Pt called Dr. Talmage Nap. She stated to stop the medicine. Pt was told by Dr. Talmage Nap to stop the medicine for full 6 weeks. Pt wanted quicker appointment. He then scheduled with endocrinolgist Dr. Lucianne Muss.  Pt had bought tylenol pm. Pt was hesitant to take the medication.   Pt has appointment to see Dr. Lucianne Muss. He has been referred to evaluate his low T.   Also in late fall he remembers 2 tick bites. He never had joint aches or suspicious rash. But he thought would mention this to [place].    Review of Systems  Constitutional: Positive for fatigue. Negative for fever and chills.  HENT: Negative for congestion, ear discharge and ear pain.   Respiratory: Negative for cough, chest tightness, shortness of breath and wheezing.   Cardiovascular: Negative for chest pain and palpitations.  Skin: Negative for rash.  Neurological: Negative for dizziness and headaches.  Hematological: Negative for adenopathy. Does not bruise/bleed easily.  Psychiatric/Behavioral: Positive for sleep disturbance and dysphoric mood. Negative for suicidal ideas, behavioral problems, confusion and agitation. The patient is nervous/anxious.       Past Medical History  Diagnosis Date  . Hypercholesteremia   . Vitamin D deficiency     Social History   Social History  . Marital Status: Married    Spouse Name: N/A  . Number of Children: 4  . Years of Education: N/A   Occupational History  . engineer    Social  History Main Topics  . Smoking status: Never Smoker   . Smokeless tobacco: Not on file  . Alcohol Use: No  . Drug Use: No  . Sexual Activity: Yes   Other Topics Concern  . Not on file   Social History Narrative    Past Surgical History  Procedure Laterality Date  . Tonsillectomy      Family History  Problem Relation Age of Onset  . Heart attack Father   . Hypothyroidism Mother     No Known Allergies  Current Outpatient Prescriptions on File Prior to Visit  Medication Sig Dispense Refill  . Vitamin D, Ergocalciferol, (DRISDOL) 50000 units CAPS capsule TAKE 1 CAPSULE (50,000 UNITS TOTAL) BY MOUTH EVERY 7 (SEVEN) DAYS. 8 capsule 1   No current facility-administered medications on file prior to visit.    BP 132/84 mmHg  Pulse 81  Temp(Src) 98.1 F (36.7 C) (Oral)  Ht  (1.803 m)  Wt 178 lb 9.6 oz (81.012 kg)  BMI 24.92 kg/m2  SpO2 96%       Objective:   Physical Exam  General Mental Status- Alert. General Appearance- Not in acute distress.   Skin General: Color- Normal Color. Moisture- Normal Moisture.  Neck No thyromegaly.  Chest and Lung Exam Auscultation: Breath Sounds:-Normal. CTA  Cardiovascular Auscultation:Rythm- Regular, rate and rythm Murmurs & Other Heart Sounds:Auscultation of the heart reveals- No Murmurs.  Neurologic Cranial Nerve exam:- CN III-XII intact(No nystagmus),  symmetric smile. Strength:- 5/5 equal and symmetric strength both upper and lower extremities.      Assessment & Plan:  For insomnia and anxiety can use vistaril if benadryl does not help you sleep.  For low T will get new endocrine opinion. Hopefully they will give opinion on thyroid antibodies as well.   PSA today since this will be important to know if t supplemented in future.  Effexor is name of med. I want you to conider for anxiety/possible depression. Rx sent  In.  Will get tick bite studies today.  I did put in pulmonology referral to evaluate  snoring in past. This is something to reconsider as well.  a1-c will be done since mild high sugar in past.  Follow up in 2 wks or as needed   Note my idea was to systematically investigate thyroid and low T first. Pt expressed hesitancy in taking medications. He indicated did not want immediate evaluation of low t. He has had problems with thyoid med. Will still pursue these possibillitiy. Expand to tick bite studies.  Both pt and wife express desperation and wife cries during interview. I explained we could try effexor as we wait for work up. I wanted pt to read and investigate effexor. Pt and wife may decide to start it sooner rather than later  Pt does have appointment with Dr. Laury Axon mid March. Did recommend he keep that appointment so we can get different perspective.

## 2016-01-27 NOTE — Patient Instructions (Addendum)
For insomnia and anxiety can use vistaril if benadryl does not help you sleep.  For low T will get new endocrine opinion. Hopefully they will give opinion on thyroid antibodies as well.   PSA today since this will be important to know if t supplemented in future.  Effexor is name of med. I want you to conider for anxiety/possible depression. Rx sent  In.  Will get tick bite studies today.  I did put in pulmonology referral to evaluate snoring in past. This is something to reconsider as well.  a1-c will be done since mild high sugar in past.  Follow up in 2 wks or as needed

## 2016-01-27 NOTE — Progress Notes (Signed)
Pre visit review using our clinic review tool, if applicable. No additional management support is needed unless otherwise documented below in the visit note. 

## 2016-01-28 LAB — PSA: PSA: 1.16 ng/mL (ref <=4.00)

## 2016-01-31 LAB — LYME ABY, WSTRN BLT IGG & IGM W/BANDS
B BURGDORFERI IGM ABS (IB): NEGATIVE
B burgdorferi IgG Abs (IB): NEGATIVE
LYME DISEASE 39 KD IGM: NONREACTIVE
LYME DISEASE 41 KD IGM: NONREACTIVE
LYME DISEASE 45 KD IGG: NONREACTIVE
LYME DISEASE 58 KD IGG: NONREACTIVE
LYME DISEASE 93 KD IGG: NONREACTIVE
Lyme Disease 18 kD IgG: NONREACTIVE
Lyme Disease 23 kD IgG: NONREACTIVE
Lyme Disease 23 kD IgM: NONREACTIVE
Lyme Disease 28 kD IgG: NONREACTIVE
Lyme Disease 30 kD IgG: NONREACTIVE
Lyme Disease 39 kD IgG: NONREACTIVE
Lyme Disease 41 kD IgG: NONREACTIVE
Lyme Disease 66 kD IgG: NONREACTIVE

## 2016-02-01 LAB — ROCKY MTN SPOTTED FVR ABS PNL(IGG+IGM)
RMSF IgG: 0.29 IV
RMSF IgM: 0.14 IV

## 2016-02-06 ENCOUNTER — Telehealth: Payer: Self-pay | Admitting: Family Medicine

## 2016-02-06 DIAGNOSIS — F411 Generalized anxiety disorder: Secondary | ICD-10-CM

## 2016-02-06 MED ORDER — ALPRAZOLAM 0.5 MG PO TABS: 0.5000 mg | ORAL_TABLET | Freq: Every evening | ORAL | Status: DC | PRN

## 2016-02-06 NOTE — Telephone Encounter (Signed)
Edward see note below and advise.  

## 2016-02-06 NOTE — Telephone Encounter (Signed)
Caller name: Vickey Huger   Relationship to patient: Self  Can be reached: [phone]  Or [phone]   Reason for call: Pt's spouse called in because she says that her husband hasn't slept in days. She says that he has anxiety and has been seen several times by Ramon Dredge. Pt's wife says that she feels that spouse need a referral to a specialist because nothing is working from provider.   Please assist further.

## 2016-02-06 NOTE — Telephone Encounter (Signed)
I am printing of xanax. You can send this to his pharmacy. I think his wife is on his Hippa form. Double check and advise. Also wanted to know did he try effexor?? I can refer to psychiatry if they want. Keep appointment with Dr. Laury Axon coming up. And see Dr. Lucianne Muss.

## 2016-02-08 ENCOUNTER — Encounter: Payer: Self-pay | Admitting: Medical

## 2016-02-08 ENCOUNTER — Ambulatory Visit (INDEPENDENT_AMBULATORY_CARE_PROVIDER_SITE_OTHER): Payer: BLUE CROSS/BLUE SHIELD | Admitting: Medical

## 2016-02-08 VITALS — BP 122/80 | HR 88 | Temp 98.1°F | Ht 71.0 in | Wt 173.0 lb

## 2016-02-08 DIAGNOSIS — R634 Abnormal weight loss: Secondary | ICD-10-CM

## 2016-02-08 DIAGNOSIS — Z1211 Encounter for screening for malignant neoplasm of colon: Secondary | ICD-10-CM

## 2016-02-08 DIAGNOSIS — F4323 Adjustment disorder with mixed anxiety and depressed mood: Secondary | ICD-10-CM | POA: Diagnosis not present

## 2016-02-08 DIAGNOSIS — R7989 Other specified abnormal findings of blood chemistry: Secondary | ICD-10-CM

## 2016-02-08 DIAGNOSIS — R5383 Other fatigue: Secondary | ICD-10-CM

## 2016-02-08 DIAGNOSIS — E291 Testicular hypofunction: Secondary | ICD-10-CM | POA: Diagnosis not present

## 2016-02-08 MED ORDER — ESCITALOPRAM OXALATE 10 MG PO TABS: 10.0000 mg | ORAL_TABLET | Freq: Every day | ORAL | Status: DC

## 2016-02-08 NOTE — Patient Instructions (Addendum)
For you mood and anxiety, I will prescribe lexapro instead of effexor. Continue with xanax at night for anxiety or insomnia.  Please call psychiatrist number that I gave you. Give me update on appointment  and if Dr. Osie Bond is in that practice. If so then I could try to talk to him directly if appointment delayed.  We will still follow his low T and potential early thyroid condition. Will get opinion of endocrinologist..  Still keep appointment with Dr. Laury Axon as you already made that appointment. Fresh perspective may be helpful.  Please keep in mind I want to fully work up his weight loss. Monitor his weight loss and if more then schedule colonoscopy and get IFOB.  Follow up with me in 4-5 wks or as needed

## 2016-02-08 NOTE — Progress Notes (Addendum)
Pre visit review using our clinic review tool, if applicable. No additional management support is needed unless otherwise documented below in the visit note.  At least 25 minutes spent today if not more.

## 2016-02-08 NOTE — Addendum Note (Signed)
Addended by: Gwenevere Abbot on: 02/08/2016 05:37 PM   Modules accepted: Level of Service

## 2016-02-08 NOTE — Progress Notes (Signed)
Subjective:    Patient ID: Bruce [name], male    DOB: 1961/11/22, 55 y.o.   MRN: [id number]  HPI   Pt in stating trouble sleeping. Recently sleeping only 2-3 hours a night. He states some anxious thoughts.Some occasional sweating. In the past was waking up at 4:30 am but recently just waking up after 2-3 hours of sleep. Then he can't go back to sleep. He has not been eating much and fatigued.  Pt was on thyroid med low dose. Intially he felt better with med then after short time he felt anxious and uncomfortable. Pt called Dr. Talmage Nap. She stated to stop the medicine. Pt was told by Dr. Talmage Nap to stop the medicine for full 6 weeks. Pt wanted quicker appointment. He then scheduled with endocrinolgist Dr. Lucianne Muss.  Pt had bought tylenol pm. Pt was hesitant to take the medication.   Pt has appointment to see Dr. Lucianne Muss. He has been referred to evaluate his low T.   Also in late fall he remembers 2 tick bites. He never had joint aches or suspicious rash. But he thought would mention this to [place].   Above is hpi from last visit. Brought into review during interview.  Since last visit I got message he had not been sleeping and he was anxious. I went ahead and rx'd xanax. Advised DC vistaril(but actually never took) and went ahead and made referral to [facility].(Wife expressed that she wanted him referred to specialist). I had written effexor the other day(pt did not want to take the effexor) Pt wife also has idea of seeing Dr. Lenore Cordia Pt wife read some information on lexapro. She feels more comfortable based on this. She read bad reports on effexor. [name] Pt did sleep with xanax the other day. He slept for 6 hours. He uses xanax again second time.  Pt expresses a lot of anxiety at work. Seems very overwhelmed at work. 2 issues at work that he can't overcome. He went to his car to relax the other day at work.  Also pt tick bite studies came back negative. So presently pt has  appointment with Dr. Lucianne Muss in near future. Also he has appointment with Dr. Laury Axon which I stressed for him to keep today.(Pt is going to see Dr. Everardo All on the 10 th. That is at 10:30)  Pt wife is concerned for weight loss. About 16 pounds since December. His psa was normal. Pt has not had colonoscospy. No history of smoking.        Review of Systems  Constitutional: Positive for fatigue. Negative for chills.  HENT: Negative for dental problem, ear discharge and ear pain.   Respiratory: Negative for cough, choking, shortness of breath and wheezing.   Cardiovascular: Negative for chest pain and palpitations.  Gastrointestinal: Negative for abdominal pain.  Musculoskeletal: Negative for back pain.  Skin: Negative for pallor.  Neurological: Negative for dizziness and headaches.  Hematological: Negative for adenopathy. Does not bruise/bleed easily.  Psychiatric/Behavioral: Positive for sleep disturbance. Negative for behavioral problems and confusion. The patient is nervous/anxious.        Objective:   Physical Exam General Mental Status- Alert. General Appearance- Not in acute distress.   Skin General: Color- Normal Color. Moisture- Normal Moisture.  Neck No thyromegaly.  Chest and Lung Exam Auscultation: Breath Sounds:-Normal. CTA  Cardiovascular Auscultation:Rythm- Regular, rate and rythm Murmurs & Other Heart Sounds:Auscultation of the heart reveals- No Murmurs.  Neurologic Cranial Nerve exam:- CN III-XII intact(No nystagmus), symmetric smile.  Strength:- 5/5 equal and symmetric strength both upper and lower extremities.       Assessment & Plan:  Wife wants to not do ifob and she wants to not get colonoscopy presently. Pt in agreement with wife.   For you mood and anxiety, I will prescribe lexapro instead or effexor. Continue with xanax at night for anxiety or insomnia.  Please call psychiatrist number that I gave you. Give me update on appointment  and if Dr.  Osie Bond is in that practice. If so then I could try to talk to him directly if appointment delayed.  We will still follow his low T and potential early thyroid condition. Will get opinion of endocrine.  Still keep appointment with Dr. Laury Axon as you already made that appointment. Fresh perspective may be helpful.  Please keep in mind I want to fully work up his weight loss. Monitor his weight loss and if more then schedule colonoscopy and get IFOB.  Follow up with me in 4-5 wks or as needed

## 2016-02-17 ENCOUNTER — Ambulatory Visit (INDEPENDENT_AMBULATORY_CARE_PROVIDER_SITE_OTHER): Payer: BLUE CROSS/BLUE SHIELD | Admitting: Endocrinology

## 2016-02-17 ENCOUNTER — Encounter: Payer: Self-pay | Admitting: Endocrinology

## 2016-02-17 ENCOUNTER — Other Ambulatory Visit: Payer: Self-pay

## 2016-02-17 VITALS — BP 126/80 | HR 87 | Temp 98.0°F | Ht 71.0 in | Wt 171.0 lb

## 2016-02-17 DIAGNOSIS — E230 Hypopituitarism: Secondary | ICD-10-CM | POA: Diagnosis not present

## 2016-02-17 DIAGNOSIS — E291 Testicular hypofunction: Secondary | ICD-10-CM

## 2016-02-17 LAB — LUTEINIZING HORMONE: LH: 2.22 m[IU]/mL (ref 1.50–9.30)

## 2016-02-17 NOTE — Patient Instructions (Addendum)
blood tests are requested for you today.  We'll let you know about the results.   i agree with the plan to see Dr Donell BeersPlovsky You are at risk for abnormal thyroid function, so you should have this checked each year.

## 2016-02-17 NOTE — Progress Notes (Signed)
Subjective:    Patient ID: Bruce [name], male    DOB: Jul 25, 1961, 55 y.o.   MRN: [id number]  HPI Pt reports he had puberty at the normal age.  He has 4 biological children.  He says he has never taken illicit androgens.  He has never been on any prescribed medication for hypogonadism.  He does not take antiandrogens or opioids.  He denies any h/o infertility, XRT, or genital infection.  He has never had surgery, or a serious injury to the head or genital area.  He does not consume alcohol.  He reports moderately excessive diaphoresis throughout the body, and assoc cold intolerance.   A few months ago, despite normal TFT, he took synthroid for fatigue x a few weeks.  He stopped due to worsening of anxiety.   Past Medical History  Diagnosis Date  . Hypercholesteremia   . Vitamin D deficiency     Past Surgical History  Procedure Laterality Date  . Tonsillectomy      Social History   Social History  . Marital Status: Married    Spouse Name: N/A  . Number of Children: 4  . Years of Education: N/A   Occupational History  . engineer    Social History Main Topics  . Smoking status: Never Smoker   . Smokeless tobacco: Not on file  . Alcohol Use: No  . Drug Use: No  . Sexual Activity: Yes   Other Topics Concern  . Not on file   Social History Narrative    Current Outpatient Prescriptions on File Prior to Visit  Medication Sig Dispense Refill  . ALPRAZolam (XANAX) 0.5 MG tablet Take 1 tablet (0.5 mg total) by mouth at bedtime as needed for anxiety or sleep. 20 tablet 0  . escitalopram (LEXAPRO) 10 MG tablet Take 1 tablet (10 mg total) by mouth at bedtime. 30 tablet 0   No current facility-administered medications on file prior to visit.    No Known Allergies  Family History  Problem Relation Age of Onset  . Heart attack Father   . Hypothyroidism Mother   . Other Neg Hx     hypogonadism    BP 126/80 mmHg  Pulse 87  Temp(Src) 98 F (36.7 C) (Oral)  Ht   (1.803 m)  Wt 171 lb (77.565 kg)  BMI 23.86 kg/m2  SpO2 97%   Review of Systems denies numbness, decreased urinary stream, gynecomastia, headache, easy bruising, sob, rash, blurry vision, rhinorrhea, chest pain.  He has insomnia, fatigue, difficulty with concentration, 20-lb weight loss, decreased appetite, decreased muscle strength, and decreased libido.      Objective:   Physical Exam VS: see vs page GEN: no distress HEAD: head: no deformity eyes: no periorbital swelling, no proptosis external nose and ears are normal mouth: no lesion seen NECK: supple, thyroid is not enlarged.   CHEST WALL: no deformity LUNGS: clear to auscultation BREASTS:  No gynecomastia CV: reg rate and rhythm, no murmur ABD: abdomen is soft, nontender.  no hepatosplenomegaly.  not distended.  no hernia.   GENITALIA:  Normal male.   MUSCULOSKELETAL: muscle bulk and strength are grossly normal.  no obvious joint swelling.  gait is normal and steady EXTEMITIES: no deformity.  no ulcer on the feet.  feet are of normal color and temp.  no edema.  PULSES: dorsalis pedis intact bilat.  no carotid bruit NEURO:  cn 2-12 grossly intact.   readily moves all 4's.  sensation is intact to touch on the  feet.  SKIN:  Normal texture and temperature.  No rash or suspicious lesion is visible.  Normal hair distribution.   NODES:  None palpable at the neck PSYCH: alert, well-oriented.  Does not appear anxious nor depressed.    Lab Results  Component Value Date   TSH 2.54 11/29/2015   Lab Results  Component Value Date   TESTOSTERONE 97* 02/17/2016  LH=2 prol=normal    Assessment & Plan:  Hypogonadism: central. uncertain etiology. worse.  Excessive diaphoresis: uncertain how or this is related to hypogonadism.  Chronic thyroiditis: he is at risk for abnormal TFT in the future.  Weight loss: uncertain etiology.  We'll revisit this after we eval pituitary.   Patient is advised the following: Patient Instructions    blood tests are requested for you today.  We'll let you know about the results.   i agree with the plan to see Dr Donell BeersPlovsky You are at risk for abnormal thyroid function, so you should have this checked each year.    addendum: I have ordered MRI

## 2016-02-18 DIAGNOSIS — E230 Hypopituitarism: Secondary | ICD-10-CM | POA: Insufficient documentation

## 2016-02-18 LAB — PROLACTIN: Prolactin: 7.4 ng/mL (ref 2.0–18.0)

## 2016-02-19 LAB — TESTOSTERONE,FREE AND TOTAL
TESTOSTERONE: 97 ng/dL — AB (ref 348–1197)
Testosterone, Free: 4.3 pg/mL — ABNORMAL LOW (ref 7.2–24.0)

## 2016-02-21 ENCOUNTER — Telehealth: Payer: Self-pay | Admitting: Endocrinology

## 2016-02-21 ENCOUNTER — Other Ambulatory Visit: Payer: Self-pay | Admitting: Family Medicine

## 2016-02-21 MED ORDER — ALPRAZOLAM 0.5 MG PO TABS: 0.5000 mg | ORAL_TABLET | Freq: Every day | ORAL | Status: DC

## 2016-02-21 NOTE — Telephone Encounter (Signed)
It can affect the MRI appearance, so it is really better to wait if at all possible

## 2016-02-21 NOTE — Telephone Encounter (Signed)
Please see message and advise 

## 2016-02-21 NOTE — Telephone Encounter (Signed)
I printed out prescition for xanax. He can pick it up on thursday when he is in to see Dr Laury AxonLowne.

## 2016-02-21 NOTE — Telephone Encounter (Signed)
Spoke to pt, told him Dr.Ellison said if you take medication now it can affect the appearance of the MRI and it is really better to wait if at all possible. Pt verbalized understanding.

## 2016-02-21 NOTE — Telephone Encounter (Signed)
Ph[facility]: [facility] [place] - [id number]  Reason for call: pt called for refill on xanax. He said he will be here Thursday for appt. He has 5 pills left and takes 1/day.

## 2016-02-21 NOTE — Telephone Encounter (Signed)
Patient as last seen 02/08/16 and Xanax filled 02/06/16 #20   Please advise      KP

## 2016-02-21 NOTE — Telephone Encounter (Signed)
Would you be willing to rx a med before the MRI is done because the pt is not feeling well and would really like to start on something now.

## 2016-02-22 ENCOUNTER — Telehealth: Payer: Self-pay | Admitting: Behavioral Health

## 2016-02-22 NOTE — Telephone Encounter (Signed)
Unable to reach patient at time of Pre-Visit Call.  Left message for patient to return call when available.    

## 2016-02-22 NOTE — Telephone Encounter (Signed)
Rx  For Xanax faxed to pharmacy per request.

## 2016-02-23 ENCOUNTER — Encounter: Payer: Self-pay | Admitting: Family Medicine

## 2016-02-23 ENCOUNTER — Ambulatory Visit (INDEPENDENT_AMBULATORY_CARE_PROVIDER_SITE_OTHER): Payer: BLUE CROSS/BLUE SHIELD | Admitting: Family Medicine

## 2016-02-23 VITALS — BP 108/70 | HR 76 | Temp 98.2°F | Ht 71.0 in | Wt 172.0 lb

## 2016-02-23 DIAGNOSIS — R7989 Other specified abnormal findings of blood chemistry: Secondary | ICD-10-CM

## 2016-02-23 DIAGNOSIS — F32A Depression, unspecified: Secondary | ICD-10-CM

## 2016-02-23 DIAGNOSIS — F329 Major depressive disorder, single episode, unspecified: Secondary | ICD-10-CM | POA: Diagnosis not present

## 2016-02-23 DIAGNOSIS — E291 Testicular hypofunction: Secondary | ICD-10-CM

## 2016-02-23 DIAGNOSIS — E230 Hypopituitarism: Secondary | ICD-10-CM | POA: Diagnosis not present

## 2016-02-23 MED ORDER — ESCITALOPRAM OXALATE 10 MG PO TABS: 10.0000 mg | ORAL_TABLET | Freq: Every day | ORAL | Status: DC

## 2016-02-23 NOTE — Assessment & Plan Note (Signed)
MRI brain per endo Endo not in office today--- pt anxious to get mri done

## 2016-02-23 NOTE — Assessment & Plan Note (Signed)
Pt is requesting a referral to urology  Pt aware MRI should be done before treating

## 2016-02-23 NOTE — Progress Notes (Signed)
Pre visit review using our clinic review tool, if applicable. No additional management support is needed unless otherwise documented below in the visit note. 

## 2016-02-23 NOTE — Progress Notes (Signed)
Patient ID: Bruce [name], male    DOB: 06-12-1961  Age: 55 y.o. MRN: [id number]    Subjective:  Subjective HPI Bruce [name] presents with his wife c/o insomnia, depression, fatigue. They are frustrated with the way things have been going with endo.    Bruce [name] feels terrible and they feel like it is taking too long to get anything done.  They are waiting for MRI to be done -----since 10th.  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  Bruce [name] testosterone is low and they are anxious to get that fixed.  They really feel this will help everything.    Review of Systems  Constitutional: Positive for activity change and fatigue. Negative for diaphoresis, appetite change and unexpected weight change.  Eyes: Negative for pain, redness and visual disturbance.  Respiratory: Negative for cough, chest tightness, shortness of breath and wheezing.   Cardiovascular: Negative for chest pain, palpitations and leg swelling.  Endocrine: Negative for cold intolerance, heat intolerance, polydipsia, polyphagia and polyuria.  Genitourinary: Negative for dysuria, urgency, frequency, hematuria, decreased urine volume, discharge, penile swelling, scrotal swelling, difficulty urinating, genital sores, penile pain and testicular pain.  Neurological: Negative for dizziness, light-headedness, numbness and headaches.  Psychiatric/Behavioral: Positive for sleep disturbance, dysphoric mood and decreased concentration. Negative for suicidal ideas, hallucinations, behavioral problems, confusion, self-injury and agitation. The patient is not nervous/anxious and is not hyperactive.     History Past Medical History  Diagnosis Date  . Hypercholesteremia   . Vitamin D deficiency     Bruce [name] has past surgical history that includes Tonsillectomy.   His family history includes Heart attack in his father; Hypothyroidism in his mother. There is no history of Other.Bruce [name] reports that Bruce [name] has never smoked. Bruce [name] does not  have any smokeless tobacco history on file. Bruce [name] reports that Bruce [name] does not drink alcohol or use illicit drugs.  Current Outpatient Prescriptions on File Prior to Visit  Medication Sig Dispense Refill  . ALPRAZolam (XANAX) 0.5 MG tablet Take 1 tablet (0.5 mg total) by mouth at bedtime. 30 tablet 0   No current facility-administered medications on file prior to visit.     Objective:  Objective Physical Exam  Constitutional: Bruce [name] is oriented to person, place, and time. Vital signs are normal. Bruce [name] appears well-developed and well-nourished. Bruce [name] is sleeping.  HENT:  Head: Normocephalic and atraumatic.  Mouth/Throat: Oropharynx is clear and moist.  Eyes: EOM are normal. Pupils are equal, round, and reactive to light.  Neck: Normal range of motion. Neck supple. No thyromegaly present.  Cardiovascular: Normal rate and regular rhythm.   No murmur heard. Pulmonary/Chest: Effort normal and breath sounds normal. No respiratory distress. Bruce [name] has no wheezes. Bruce [name] has no rales. Bruce [name] exhibits no tenderness.  Musculoskeletal: Bruce [name] exhibits no edema or tenderness.  Neurological: Bruce [name] is alert and oriented to person, place, and time.  Skin: Skin is warm and dry.  Psychiatric: Bruce [name] has a normal mood and affect. His behavior is normal. Judgment and thought content normal.  Nursing note and vitals reviewed.  BP 108/70 mmHg  Pulse 76  Temp(Src) 98.2 F (36.8 C) (Oral)  Ht  (1.803 m)  Wt 172 lb (78.019 kg)  BMI 24.00 kg/m2  SpO2 98% Wt Readings from Last 3 Encounters:  02/23/16 172 lb (78.019 kg)  02/17/16 171 lb (77.565 kg)  02/08/16 173 lb (78.472 kg)     Lab Results  Component Value Date   WBC 6.7 11/29/2015  HGB 15.0 11/29/2015   HCT 45.7 11/29/2015   PLT 272.0 11/29/2015   GLUCOSE 102* 11/29/2015   CHOL 232* 11/29/2015   TRIG 220.0* 11/29/2015   HDL 42.10 11/29/2015   LDLDIRECT 146.0 11/29/2015   LDLCALC 84 11/23/2011   ALT 19 11/29/2015   AST 21 11/29/2015   NA 140 11/29/2015   K 4.0  11/29/2015   CL 102 11/29/2015   CREATININE 1.03 11/29/2015   BUN 21 11/29/2015   CO2 30 11/29/2015   TSH 2.54 11/29/2015   PSA 1.16 01/27/2016   HGBA1C 5.5 01/27/2016    No results found.   Assessment & Plan:  Plan I have discontinued Bruce [name]'s escitalopram. I am also having him start on escitalopram. Additionally, I am having him maintain his ALPRAZolam.  Meds ordered this encounter  Medications  . escitalopram (LEXAPRO) 10 MG tablet    Sig: Take 1 tablet (10 mg total) by mouth at bedtime.    Dispense:  30 tablet    Refill:  0    Problem List Items Addressed This Visit    Hypogonadism, male    Pt is requesting a referral to urology  Pt aware MRI should be done before treating       Pituitary insufficiency (HCC)    MRI brain per endo Endo not in office today--- pt anxious to get mri done       Relevant Orders   MR Brain W Wo Contrast    Other Visit Diagnoses    Depression    -  Primary    Relevant Medications    escitalopram (LEXAPRO) 10 MG tablet    Low serum testosterone level        Relevant Orders    Ambulatory referral to Urology       Follow-up: Return if symptoms worsen or fail to improve.  Loreen FreudYvonne Lowne, DO

## 2016-02-23 NOTE — Patient Instructions (Signed)
Testosterone Testosterone is a hormone made by the male's testicles and by the adrenal glands, which are a pair of glands on top of the kidneys. Starting at puberty, testosterone stimulates the development of secondary sex characteristics. This includes a deeper voice, growth of muscles and body hair, and penis enlargement.  Females also produce testosterone in both the adrenal glands and ovaries. A male's body converts testosterone into estradiol, the main male sex hormone. An abnormal level of testosterone can cause health issues in both males and females. You may have this test if your health care provider suspects that an abnormal testosterone level is causing or contributing to other health problems. In males, symptoms of an abnormal testosterone level include:  Infertility.  Erectile dysfunction.  Delayed puberty or premature puberty. In females, symptoms of an abnormally high testosterone level include:  Infertility.  Polycystic ovarian syndrome (PCOS).  Developing masculine features (virilization). This test requires a blood sample taken from a vein in your arm or hand. The sample for this test is usually collected in the morning. The amount of testosterone in your blood is highest at that time. RESULTS It is your responsibility to obtain your test results. Ask the lab or department performing the test when and how you will get your results. Contact your health care provider to discuss any questions you have about your results.  The result of a blood test for testosterone will be given as a range of values. A testosterone level that is outside the normal range may indicate a health problem. Testosterone is measured in nanograms per deciliter (ng/dL). Range of Normal Values Ranges for normal values may vary among different labs and hospitals. You should always check with your health care provider after having lab work or other tests done to discuss whether your values are considered  within normal limits. Normal levels of total testosterone are as follows:  Male:  7 months to 54 years old: less than 30 ng/dL.  10-13 years old: less than 300 ng/dL.  14-15 years old: 170-540 ng/dL.  16-19 years old: 250-910 ng/dL.  54 years old and over: 280-1,080 ng/dL.  Male:  7 months to 54 years old: less than 30 ng/dL.  10-13 years old: less than 40 ng/dL.  14-15 years old: less than 60 ng/dL.  16-19 years old: less than 70 ng/dL.  54 years old and over: less than 70 ng/dL. Meaning of Results Outside Normal Value Ranges A testosterone level that is too low or too high can indicate a number of health problems. In males:  A high testosterone level can occur if you:  Have certain types of tumors.  Have an overactive thyroid gland (hyperthyroidism).  Use anabolic steroids.  Are starting puberty early (precocious puberty).  Have an inherited disorder that affects the adrenal glands (congenital adrenal hyperplasia).  A low testosterone level can occur if you:  Have certain genetic diseases.  Have had certain viral infections, such as mumps.  Have pituitary disease.  Have had an injury to the testicles.  Are an alcoholic. In females:  A high testosterone level can occur if you have:  Certain types of tumors.  An inherited disorder that affects certain cells in the adrenal glands (congenital adrenocortical hyperplasia).  PCOS.  A low testosterone level does not cause health problems. Discuss the results of your testosterone test with your health care provider. Your health care provider will use the results of this test and other tests to make a diagnosis.   This information   is not intended to replace advice given to you by your health care provider. Make sure you discuss any questions you have with your health care provider.   Document Released: 12/13/2004 Document Revised: 12/17/2014 Document Reviewed: 03/24/2014 Elsevier Interactive Patient  Education 2016 [employer]  

## 2016-02-24 ENCOUNTER — Other Ambulatory Visit: Payer: Self-pay | Admitting: Endocrinology

## 2016-02-24 ENCOUNTER — Encounter: Payer: Self-pay | Admitting: Family Medicine

## 2016-02-24 ENCOUNTER — Telehealth: Payer: Self-pay

## 2016-02-24 MED ORDER — CLOMIPHENE CITRATE 50 MG PO TABS: ORAL_TABLET | ORAL | Status: DC

## 2016-02-24 NOTE — Telephone Encounter (Signed)
error:[id number] ° °

## 2016-02-24 NOTE — Telephone Encounter (Signed)
I contacted the pt's wife and advised about the Clomid start. Pt is not going to do the MRI (we ordered) or start the Clomid medication at this time. She stated they are looking at going see a urologist and will have the MRI completed that Dr. Laury AxonLowne ordered.

## 2016-02-25 ENCOUNTER — Ambulatory Visit (HOSPITAL_BASED_OUTPATIENT_CLINIC_OR_DEPARTMENT_OTHER)
Admission: RE | Admit: 2016-02-25 | Discharge: 2016-02-25 | Disposition: A | Discharge status: Home / Self Care | Payer: BLUE CROSS/BLUE SHIELD | Source: Ambulatory Visit | Attending: Family Medicine | Admitting: Family Medicine

## 2016-02-25 DIAGNOSIS — E230 Hypopituitarism: Secondary | ICD-10-CM | POA: Diagnosis present

## 2016-02-25 DIAGNOSIS — E236 Other disorders of pituitary gland: Secondary | ICD-10-CM | POA: Insufficient documentation

## 2016-02-25 MED ORDER — GADOBENATE DIMEGLUMINE 529 MG/ML IV SOLN: 8.0000 mL | Freq: Once | INTRAVENOUS | Status: DC | PRN

## 2016-02-27 ENCOUNTER — Telehealth: Payer: Self-pay | Admitting: Endocrinology

## 2016-02-27 ENCOUNTER — Telehealth: Payer: Self-pay | Admitting: Family Medicine

## 2016-02-27 DIAGNOSIS — E291 Testicular hypofunction: Secondary | ICD-10-CM

## 2016-02-27 DIAGNOSIS — E230 Hypopituitarism: Secondary | ICD-10-CM

## 2016-02-27 NOTE — Telephone Encounter (Signed)
Patient and wife are aware and they will follow up with Dr.Ellison.    KP

## 2016-02-27 NOTE — Telephone Encounter (Signed)
Myrene BuddyYvonne  Yes, this should be considered normal. I cc'ed the pt's message to brian and stacey, to ask for pcc help.   Gregary SignsSean

## 2016-02-27 NOTE — Telephone Encounter (Signed)
Patients wife called stating that she would like to speak Megan regarding possible follow up?  Please advise    Thank you

## 2016-02-27 NOTE — Telephone Encounter (Signed)
Looks completely normal except for a 2mm cyst on pituitary -- most likely benign Waiting for specialty input

## 2016-02-27 NOTE — Telephone Encounter (Signed)
I contacted the pt's wife. She stated she would like for you to review the MRI report from 02/25/2016. She also stated the pt did end up starting the clomid. She and the pt would like to know based of the results of the MRI and the medication start when the pt should have a follow up appointment, follow up blood tests and what the course of treatment we are looking at for the pt.  Please advise, Thanks!

## 2016-02-27 NOTE — Telephone Encounter (Signed)
i spoke with Dr Everardo AllEllison since he is the one who ordered it---- it is completely normal We will also send it to the urologist

## 2016-02-27 NOTE — Telephone Encounter (Signed)
Please advise      KP 

## 2016-02-27 NOTE — Telephone Encounter (Signed)
No problem--with this good result, no need to worry

## 2016-02-27 NOTE — Telephone Encounter (Signed)
What speciality input are you waiting for per wife Lana.      KP

## 2016-02-27 NOTE — Telephone Encounter (Signed)
Caller name:Lana  Relationship to patient:spouse Can be reached:[phone] Pharmacy:  Reason for call:Requesting MRI result

## 2016-02-28 ENCOUNTER — Ambulatory Visit: Payer: BLUE CROSS/BLUE SHIELD | Admitting: Endocrinology

## 2016-02-28 NOTE — Telephone Encounter (Signed)
I contacted the pt's wife and advised of note below and voiced understanding. Pt is scheduled for 03/27/2016.

## 2016-02-28 NOTE — Telephone Encounter (Signed)
Please repeat the blood test in 1 month.   i have requested--you can do here or at Dr Ernst SpellLowne's office.

## 2016-02-28 NOTE — Telephone Encounter (Signed)
Pt still needs to know what the course of treatment is. When should he come back for a follow up and when should his next blood test be?

## 2016-03-12 ENCOUNTER — Ambulatory Visit (INDEPENDENT_AMBULATORY_CARE_PROVIDER_SITE_OTHER): Payer: BLUE CROSS/BLUE SHIELD | Admitting: Family Medicine

## 2016-03-12 ENCOUNTER — Encounter: Payer: Self-pay | Admitting: Family Medicine

## 2016-03-12 VITALS — BP 116/72 | HR 87 | Temp 99.1°F | Ht 71.0 in | Wt 169.2 lb

## 2016-03-12 DIAGNOSIS — F32A Depression, unspecified: Secondary | ICD-10-CM

## 2016-03-12 DIAGNOSIS — F329 Major depressive disorder, single episode, unspecified: Secondary | ICD-10-CM | POA: Diagnosis not present

## 2016-03-12 DIAGNOSIS — G4700 Insomnia, unspecified: Secondary | ICD-10-CM

## 2016-03-12 MED ORDER — BUPROPION HCL ER (XL) 150 MG PO TB24: ORAL_TABLET | ORAL | Status: DC

## 2016-03-12 MED ORDER — BUPROPION HCL ER (XL) 300 MG PO TB24: 300.0000 mg | ORAL_TABLET | Freq: Every day | ORAL | Status: DC

## 2016-03-12 NOTE — Progress Notes (Signed)
Pre visit review using our clinic review tool, if applicable. No additional management support is needed unless otherwise documented below in the visit note. 

## 2016-03-12 NOTE — Patient Instructions (Signed)
Bupropion extended-release tablets (Depression/Mood Disorders)  What is this medicine?  BUPROPION (byoo PROE pee on) is used to treat depression.  This medicine may be used for other purposes; ask your health care provider or pharmacist if you have questions.  What should I tell my health care provider before I take this medicine?  They need to know if you have any of these conditions:  -an eating disorder, such as anorexia or bulimia  -bipolar disorder or psychosis  -diabetes or high blood sugar, treated with medication  -glaucoma  -head injury or brain tumor  -heart disease, previous heart attack, or irregular heart beat  -high blood pressure  -kidney or liver disease  -seizures (convulsions)  -suicidal thoughts or a previous suicide attempt  -Tourette's syndrome  -weight loss  -an unusual or allergic reaction to bupropion, other medicines, foods, dyes, or preservatives  -breast-feeding  -pregnant or trying to become pregnant  How should I use this medicine?  Take this medicine by mouth with a glass of water. Follow the directions on the prescription label. You can take it with or without food. If it upsets your stomach, take it with food. Do not crush, chew, or cut these tablets. This medicine is taken once daily at the same time each day. Do not take your medicine more often than directed. Do not stop taking this medicine suddenly except upon the advice of your doctor. Stopping this medicine too quickly may cause serious side effects or your condition may worsen.  A special MedGuide will be given to you by the pharmacist with each prescription and refill. Be sure to read this information carefully each time.  Talk to your pediatrician regarding the use of this medicine in children. Special care may be needed.  Overdosage: If you think you have taken too much of this medicine contact a poison control center or emergency room at once.  NOTE: This medicine is only for you. Do not share this medicine with  others.  What if I miss a dose?  If you miss a dose, skip the missed dose and take your next tablet at the regular time. Do not take double or extra doses.  What may interact with this medicine?  Do not take this medicine with any of the following medications:  -linezolid  -MAOIs like Azilect, Carbex, Eldepryl, Marplan, Nardil, and Parnate  -methylene blue (injected into a vein)  -other medicines that contain bupropion like Zyban  This medicine may also interact with the following medications:  -alcohol  -certain medicines for anxiety or sleep  -certain medicines for blood pressure like metoprolol, propranolol  -certain medicines for depression or psychotic disturbances  -certain medicines for HIV or AIDS like efavirenz, lopinavir, nelfinavir, ritonavir  -certain medicines for irregular heart beat like propafenone, flecainide  -certain medicines for Parkinson's disease like amantadine, levodopa  -certain medicines for seizures like carbamazepine, phenytoin, phenobarbital  -cimetidine  -clopidogrel  -cyclophosphamide  -furazolidone  -isoniazid  -nicotine  -orphenadrine  -procarbazine  -steroid medicines like prednisone or cortisone  -stimulant medicines for attention disorders, weight loss, or to stay awake  -tamoxifen  -theophylline  -thiotepa  -ticlopidine  -tramadol  -warfarin  This list may not describe all possible interactions. Give your health care provider a list of all the medicines, herbs, non-prescription drugs, or dietary supplements you use. Also tell them if you smoke, drink alcohol, or use illegal drugs. Some items may interact with your medicine.  What should I watch for while using this medicine?    Tell your doctor if your symptoms do not get better or if they get worse. Visit your doctor or health care professional for regular checks on your progress. Because it may take several weeks to see the full effects of this medicine, it is important to continue your treatment as prescribed by your  doctor.  Patients and their families should watch out for new or worsening thoughts of suicide or depression. Also watch out for sudden changes in feelings such as feeling anxious, agitated, panicky, irritable, hostile, aggressive, impulsive, severely restless, overly excited and hyperactive, or not being able to sleep. If this happens, especially at the beginning of treatment or after a change in dose, call your health care professional.  Avoid alcoholic drinks while taking this medicine. Drinking large amounts of alcoholic beverages, using sleeping or anxiety medicines, or quickly stopping the use of these agents while taking this medicine may increase your risk for a seizure.  Do not drive or use heavy machinery until you know how this medicine affects you. This medicine can impair your ability to perform these tasks.  Do not take this medicine close to bedtime. It may prevent you from sleeping.  Your mouth may get dry. Chewing sugarless gum or sucking hard candy, and drinking plenty of water may help. Contact your doctor if the problem does not go away or is severe.  The tablet shell for some brands of this medicine does not dissolve. This is normal. The tablet shell may appear whole in the stool. This is not a cause for concern.  What side effects may I notice from receiving this medicine?  Side effects that you should report to your doctor or health care professional as soon as possible:  -allergic reactions like skin rash, itching or hives, swelling of the face, lips, or tongue  -breathing problems  -changes in vision  -confusion  -fast or irregular heartbeat  -hallucinations  -increased blood pressure  -redness, blistering, peeling or loosening of the skin, including inside the mouth  -seizures  -suicidal thoughts or other mood changes  -unusually weak or tired  -vomiting  Side effects that usually do not require medical attention (report to your doctor or health care professional if they continue or are  bothersome):  -change in sex drive or performance  -constipation  -headache  -loss of appetite  -nausea  -tremors  -weight loss  This list may not describe all possible side effects. Call your doctor for medical advice about side effects. You may report side effects to FDA at 1-800-FDA-1088.  Where should I keep my medicine?  Keep out of the reach of children.  Store at room temperature between 15 and 30 degrees C (59 and 86 degrees F). Throw away any unused medicine after the expiration date.  NOTE: This sheet is a summary. It may not cover all possible information. If you have questions about this medicine, talk to your doctor, pharmacist, or health care provider.     © 2016, Elsevier/[employer]. (2013-06-19 12:39:42)

## 2016-03-12 NOTE — Progress Notes (Signed)
Patient ID: Bruce PoissonZane Gastineau, male    DOB: Jun 12, 1961  Age: 55 y.o. MRN: [id number]    Subjective:  Subjective HPI Bruce [name] presents for f/u depression and sleep.  The lexapro caused chest pain so he stopped it His wife is very anxious.   He has flat affect.  He is feeling better with the clomid and is just needing something to work on his depression.    Review of Systems  Constitutional: Negative.  Negative for diaphoresis, appetite change, fatigue and unexpected weight change.  HENT: Negative for congestion, ear pain, hearing loss, nosebleeds, postnasal drip, rhinorrhea, sinus pressure, sneezing and tinnitus.   Eyes: Negative for photophobia, pain, discharge, redness, itching and visual disturbance.  Respiratory: Negative.  Negative for cough, chest tightness, shortness of breath and wheezing.   Cardiovascular: Negative.  Negative for chest pain, palpitations and leg swelling.  Gastrointestinal: Negative for abdominal pain, constipation, blood in stool, abdominal distention and anal bleeding.  Endocrine: Negative.  Negative for cold intolerance, heat intolerance, polydipsia, polyphagia and polyuria.  Genitourinary: Negative.  Negative for dysuria, frequency and difficulty urinating.  Musculoskeletal: Negative.   Skin: Negative.   Allergic/Immunologic: Negative.   Neurological: Negative for dizziness, weakness, light-headedness, numbness and headaches.  Psychiatric/Behavioral: Positive for sleep disturbance, dysphoric mood and decreased concentration. Negative for suicidal ideas, confusion and agitation. The patient is not nervous/anxious.     History Past Medical History  Diagnosis Date  . Hypercholesteremia   . Vitamin D deficiency     He has past surgical history that includes Tonsillectomy.   His family history includes Heart attack in his father; Hypothyroidism in his mother. There is no history of Other.He reports that he has never smoked. He does not have any smokeless tobacco  history on file. He reports that he does not drink alcohol or use illicit drugs.  Current Outpatient Prescriptions on File Prior to Visit  Medication Sig Dispense Refill  . ALPRAZolam (XANAX) 0.5 MG tablet Take 1 tablet (0.5 mg total) by mouth at bedtime. 30 tablet 0  . clomiPHENE (CLOMID) 50 MG tablet 1/4 tab daily 15 tablet 3   No current facility-administered medications on file prior to visit.     Objective:  Objective Physical Exam  Constitutional: He is oriented to person, place, and time. Vital signs are normal. He appears well-developed and well-nourished. He is sleeping.  HENT:  Head: Normocephalic and atraumatic.  Mouth/Throat: Oropharynx is clear and moist.  Eyes: EOM are normal. Pupils are equal, round, and reactive to light.  Neck: Normal range of motion. Neck supple. No thyromegaly present.  Cardiovascular: Normal rate and regular rhythm.   No murmur heard. Pulmonary/Chest: Effort normal and breath sounds normal. No respiratory distress. He has no wheezes. He has no rales. He exhibits no tenderness.  Musculoskeletal: He exhibits no edema or tenderness.  Neurological: He is alert and oriented to person, place, and time.  Skin: Skin is warm and dry.  Psychiatric: His speech is normal and behavior is normal. Judgment and thought content normal. His mood appears anxious. His affect is blunt. He exhibits a depressed mood.  Nursing note and vitals reviewed.  BP 116/72 mmHg  Pulse 87  Temp(Src) 99.1 F (37.3 C) (Oral)  Ht 5\' 11"  (1.803 m)  Wt 169 lb 3.2 oz (76.749 kg)  BMI 23.61 kg/m2  SpO2 98% Wt Readings from Last 3 Encounters:  03/12/16 169 lb 3.2 oz (76.749 kg)  02/23/16 172 lb (78.019 kg)  02/17/16 171 lb (77.565 kg)  Lab Results  Component Value Date   WBC 6.7 11/29/2015   HGB 15.0 11/29/2015   HCT 45.7 11/29/2015   PLT 272.0 11/29/2015   GLUCOSE 102* 11/29/2015   CHOL 232* 11/29/2015   TRIG 220.0* 11/29/2015   HDL 42.10 11/29/2015   LDLDIRECT  146.0 11/29/2015   LDLCALC 84 11/23/2011   ALT 19 11/29/2015   AST 21 11/29/2015   NA 140 11/29/2015   K 4.0 11/29/2015   CL 102 11/29/2015   CREATININE 1.03 11/29/2015   BUN 21 11/29/2015   CO2 30 11/29/2015   TSH 2.54 11/29/2015   PSA 1.16 01/27/2016   HGBA1C 5.5 01/27/2016    Mr Brain W Wo Contrast  02/25/2016  CLINICAL DATA:  Pituitary insufficiency.  Low testosterone level. EXAM: MRI HEAD WITHOUT AND WITH CONTRAST TECHNIQUE: Multiplanar, multiecho pulse sequences of the brain and surrounding structures were obtained without and with intravenous contrast. CONTRAST:  8 mL MultiHance IV COMPARISON:  none FINDINGS: Dynamic pituitary protocol with thin sections through the sella. Pituitary normal in size. 2 mm cyst posterior pituitary. Anterior lobe of the pituitary enhances homogeneously without microadenoma. Infundibulum midline. No compression optic chiasm. Cavernous sinus normal. Ventricle size normal.  Cerebral volume normal. Negative for acute or chronic ischemia. Negative for demyelinating disease. Negative for intracranial hemorrhage. No intracranial mass or edema. Postcontrast imaging of the entire brain reveals normal enhancement. No enhancing mass lesion. Normal vascular enhancement. Mild mucosal edema in the paranasal sinuses. No air-fluid level. Skullbase intact. 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 Remainder of the pituitary normal. Electronically Signed   By: Marlan Palau M.D.   On: 02/25/2016 15:16     Assessment & Plan:  Plan I have discontinued Mr. Feasel escitalopram. I am also having him start on buPROPion and buPROPion. Additionally, I am having him maintain his ALPRAZolam and clomiPHENE.  Meds ordered this encounter  Medications  . buPROPion (WELLBUTRIN XL) 150 MG 24 hr tablet    Sig: 1 po qam x 1 week then increase to 2 po qd    Dispense:  60 tablet    Refill:  0  . buPROPion (WELLBUTRIN XL) 300 MG 24 hr tablet    Sig: Take 1 tablet (300 mg  total) by mouth daily.    Dispense:  30 tablet    Refill:  5    Problem List Items Addressed This Visit    None    Visit Diagnoses    Depression    -  Primary    Relevant Medications    buPROPion (WELLBUTRIN XL) 150 MG 24 hr tablet    buPROPion (WELLBUTRIN XL) 300 MG 24 hr tablet       Follow-up: Return in about 4 weeks (around 04/09/2016), or if symptoms worsen or fail to improve, for depression.  Donato Schultz, DO

## 2016-03-12 NOTE — Assessment & Plan Note (Signed)
con't xanax prn Discussed belsomra--  Pt is nor interested at this time

## 2016-03-15 NOTE — Telephone Encounter (Signed)
Error/gd °

## 2016-03-16 ENCOUNTER — Telehealth: Payer: Self-pay | Admitting: Endocrinology

## 2016-03-16 NOTE — Telephone Encounter (Signed)
PT requests call back at your earliest convenience

## 2016-03-16 NOTE — Telephone Encounter (Signed)
I contacted the pt. Pt stated he feels like he is declining taking the clomid medication. Pt states he does not have energy and has not been felt any different since starting the clomid. Pt stated he has been dealing with hair loss and weight loss. Pt stated he is frustrated with the lack of progress and stated he would like a call back from you to discuss. Pt did not want to share all of what is going on with me at this time.  Please advise, Thanks!

## 2016-03-16 NOTE — Telephone Encounter (Signed)
i called pt today. Left message on VM. Please come in to check testosterone, and tell us if you are on clomid or not.

## 2016-03-19 ENCOUNTER — Telehealth: Payer: Self-pay | Admitting: Family Medicine

## 2016-03-19 MED ORDER — CLONAZEPAM 0.5 MG PO TABS: 0.5000 mg | ORAL_TABLET | Freq: Every day | ORAL | Status: DC

## 2016-03-19 NOTE — Telephone Encounter (Signed)
Klonopin 0.5 mg #30  1 po qhs prn

## 2016-03-19 NOTE — Telephone Encounter (Signed)
Caller name: Lana Relationship to patient: Wife Can be reached: [phone] [facility] [place] [address] [phone] (Phone) [phone])         Reason for call: Wife states that Xanax is no longer working for the patient and they were told that he could get another medication for sleep.

## 2016-03-19 NOTE — Telephone Encounter (Signed)
Wife is aware the Rx has been sent.   KP

## 2016-03-19 NOTE — Telephone Encounter (Signed)
Please advise on an alternative medication to replace Xanax.   KP

## 2016-03-21 ENCOUNTER — Telehealth: Payer: Self-pay | Admitting: Family Medicine

## 2016-03-21 NOTE — Telephone Encounter (Signed)
Please call and schedule an appt to see PCP within 2 weeks.

## 2016-03-21 NOTE — Telephone Encounter (Signed)
PLEASE NOTE: All timestamps contained within this report are represented as [place] Standard Time. CONFIDENTIALTY NOTICE: This fax transmission is intended only for the addressee. It contains information that is legally privileged, confidential or otherwise protected from use or disclosure. If you are not the intended recipient, you are strictly prohibited from reviewing, disclosing, copying using or disseminating any of this information or taking any action in reliance on or regarding this information. If you have received this fax in error, please notify us immediately by telephone so that we can arrange for its return to us. Phone: [phone], Toll-Free: [phone], Fax: [phone] Page: 1 of 1 Call Id: [id number] [facility] - Client TELEPHONE ADVICE RECORD [facility] Patient Name: Bruce [name] DOB: November 15, 1961 Initial Comment Caller states having constipation for 6 days. Nurse Assessment Nurse: Loletta Specterorbin, RN, Misty StanleyLisa Date/Time Lamount Cohen(Eastern Time): 03/21/2016 9:38:17 AM Confirm and document reason for call. If symptomatic, describe symptoms. You must click the next button to save text entered. ---Caller states he has been consitpated x 6 days. He has 2 small BM, one yesterday and one 2 days before that but they were very small. He is on wellbutrin and takes 1 xanax nightly for sleep. Has the patient traveled out of the country within the last 30 days? ---Not Applicable Does the patient have any new or worsening symptoms? ---Yes Will a triage be completed? ---Yes Related visit to physician within the last 2 weeks? ---No Does the PT have any chronic conditions? (i.e. diabetes, asthma, etc.) ---No Is this a behavioral health or substance abuse call? ---No Guidelines Guideline Title Affirmed Question Affirmed Notes Constipation Constipation is a chronic symptom (recurrent or ongoing AND present > 4 weeks) Final Disposition User See PCP within 2  Marin OlpWeeks Corbin, RN, Misty StanleyLisa Disagree/Comply: Danella Maiersomply

## 2016-03-21 NOTE — Telephone Encounter (Signed)
Called to follow up with patient.  Left a message for call back.   

## 2016-03-21 NOTE — Telephone Encounter (Signed)
Relation to JY:NWGNpt:self Call back number:[phone]   Reason for call:  Patient states she he started buPROPion (WELLBUTRIN XL) 150 MG 24 hr tablet he has been constipated no full bowel in 6 days. Patient was transferred to team health. Patient stated he would like to speak with Selena BattenKim

## 2016-03-22 ENCOUNTER — Ambulatory Visit (HOSPITAL_BASED_OUTPATIENT_CLINIC_OR_DEPARTMENT_OTHER)
Admission: RE | Admit: 2016-03-22 | Discharge: 2016-03-22 | Disposition: A | Discharge status: Home / Self Care | Payer: BLUE CROSS/BLUE SHIELD | Source: Ambulatory Visit | Attending: Family Medicine | Admitting: Family Medicine

## 2016-03-22 ENCOUNTER — Ambulatory Visit (INDEPENDENT_AMBULATORY_CARE_PROVIDER_SITE_OTHER): Payer: BLUE CROSS/BLUE SHIELD | Admitting: Family Medicine

## 2016-03-22 ENCOUNTER — Encounter: Payer: Self-pay | Admitting: Family Medicine

## 2016-03-22 VITALS — BP 124/76 | HR 72 | Temp 99.0°F | Wt 163.6 lb

## 2016-03-22 DIAGNOSIS — K5901 Slow transit constipation: Secondary | ICD-10-CM | POA: Diagnosis not present

## 2016-03-22 DIAGNOSIS — K5900 Constipation, unspecified: Secondary | ICD-10-CM | POA: Diagnosis present

## 2016-03-22 NOTE — Telephone Encounter (Signed)
Take a stool softener ie colace Take miralax daily

## 2016-03-22 NOTE — Progress Notes (Signed)
Patient ID: Bruce [name], male    DOB: 16-Apr-1961  Age: 55 y.o. MRN: [id number]    Subjective:  Subjective HPI Bruce [name] presents for constipation x 1 week.  He has only passed pebble sized hard stool since being on wellbutrin.  Pt is reluctant to try enema, mag citrated etc.   He is very anxious about this.   His mother is with him.    Review of Systems  Constitutional: Negative for diaphoresis, appetite change, fatigue and unexpected weight change.  Eyes: Negative for pain, redness and visual disturbance.  Respiratory: Negative for cough, chest tightness, shortness of breath and wheezing.   Cardiovascular: Negative for chest pain, palpitations and leg swelling.  Gastrointestinal: Positive for constipation. Negative for blood in stool.  Endocrine: Negative for cold intolerance, heat intolerance, polydipsia, polyphagia and polyuria.  Genitourinary: Negative for dysuria, frequency and difficulty urinating.  Neurological: Negative for dizziness, light-headedness, numbness and headaches.    History Past Medical History  Diagnosis Date  . Hypercholesteremia   . Vitamin D deficiency     He has past surgical history that includes Tonsillectomy.   His family history includes Heart attack in his father; Hypothyroidism in his mother. There is no history of Other.He reports that he has never smoked. He does not have any smokeless tobacco history on file. He reports that he does not drink alcohol or use illicit drugs.  Current Outpatient Prescriptions on File Prior to Visit  Medication Sig Dispense Refill  . buPROPion (WELLBUTRIN XL) 150 MG 24 hr tablet 1 po qam x 1 week then increase to 2 po qd 60 tablet 0  . clomiPHENE (CLOMID) 50 MG tablet 1/4 tab daily 15 tablet 3  . buPROPion (WELLBUTRIN XL) 300 MG 24 hr tablet Take 1 tablet (300 mg total) by mouth daily. (Patient not taking: Reported on 03/22/2016) 30 tablet 5  . clonazePAM (KLONOPIN) 0.5 MG tablet Take 1 tablet (0.5 mg total) by mouth  at bedtime. (Patient not taking: Reported on 03/22/2016) 30 tablet 0   No current facility-administered medications on file prior to visit.     Objective:  Objective Physical Exam  Constitutional: He appears well-developed and well-nourished.  Abdominal: Soft. He exhibits no distension and no mass. Bowel sounds are decreased. There is no tenderness. There is no rebound and no guarding.  Psychiatric: His speech is normal. His affect is blunt. He exhibits a depressed mood.  Nursing note and vitals reviewed.  BP 124/76 mmHg  Pulse 72  Temp(Src) 99 F (37.2 C) (Oral)  Wt 163 lb 9.6 oz (74.208 kg)  SpO2 97% Wt Readings from Last 3 Encounters:  03/22/16 163 lb 9.6 oz (74.208 kg)  03/12/16 169 lb 3.2 oz (76.749 kg)  02/23/16 172 lb (78.019 kg)     Lab Results  Component Value Date   WBC 6.7 11/29/2015   HGB 15.0 11/29/2015   HCT 45.7 11/29/2015   PLT 272.0 11/29/2015   GLUCOSE 102* 11/29/2015   CHOL 232* 11/29/2015   TRIG 220.0* 11/29/2015   HDL 42.10 11/29/2015   LDLDIRECT 146.0 11/29/2015   LDLCALC 84 11/23/2011   ALT 19 11/29/2015   AST 21 11/29/2015   NA 140 11/29/2015   K 4.0 11/29/2015   CL 102 11/29/2015   CREATININE 1.03 11/29/2015   BUN 21 11/29/2015   CO2 30 11/29/2015   TSH 2.54 11/29/2015   PSA 1.16 01/27/2016   HGBA1C 5.5 01/27/2016    No results found.   Assessment & Plan:  Plan  I am having [name] maintain his clomiPHENE, buPROPion, buPROPion, and clonazePAM.  No orders of the defined types were placed in this encounter.    Problem List Items Addressed This Visit      Unprioritized   Constipation - Primary    Check xray r/o obstruction Try enema first ---  Fleets or suppository If xray neg --consider mag citrate If abd pain over weekend go to ER       Relevant Orders   DG Abd 2 Views      Follow-up: Return if symptoms worsen or fail to improve.  Donato SchultzYvonne R Lowne Chase, DO

## 2016-03-22 NOTE — Telephone Encounter (Signed)
Left message for pt to return my call.

## 2016-03-22 NOTE — Telephone Encounter (Signed)
I made the patient aware of the below information and he stated he was concerned because it has already been 7 days and he wanted to know how long he should wait before having a bowel movement. He said the Milk of Mag did not help yesterday and he isn't really sure what the Miralax and colace would do to help. I explained to the patient that he could try and if no BM by Monday he can give us a call. He said to ask your suggestion again, he is not comfortable with the first suggestion because he has already been 7 days without a BM.  Please advise     KP

## 2016-03-22 NOTE — Assessment & Plan Note (Addendum)
Check xray r/o obstruction Try enema first ---  Fleets or suppository If xray neg --consider mag citrate If abd pain over weekend go to ER  See AVS

## 2016-03-22 NOTE — Telephone Encounter (Signed)
miralax is a mild laxative---- he can try Mag citrate otc if he wants something stronger

## 2016-03-22 NOTE — Telephone Encounter (Signed)
Call from patient and he stated the medication has him constipated for 7 days, he said the Nurse advised him to take Milk of Mag yesterday and he had did it twice with no success, he said he is going very little. He said he is still taking the medication daily. He is taking Xanax and Wellbutrin, some pain int he mid back, no abdominal pain and not eating much because he is afraid to get further backed up. Denied fever, Denied blood in the stool.No OTC supplements. He has increased his water intake. Please advise       KP

## 2016-03-22 NOTE — Telephone Encounter (Signed)
For how many days? And do you wants him to discontinue the medications that is making him constipated?   KP

## 2016-03-22 NOTE — Patient Instructions (Signed)

## 2016-03-22 NOTE — Telephone Encounter (Signed)
Patient in the office       KP 

## 2016-03-22 NOTE — Telephone Encounter (Signed)
No -- keep taking wellbutrin--- a lot of times once he gets used to it it gets better miralax safe to take 1x daily ----f/u with us next week if no better

## 2016-03-22 NOTE — Progress Notes (Signed)
Pre visit review using our clinic review tool, if applicable. No additional management support is needed unless otherwise documented below in the visit note. 

## 2016-03-26 ENCOUNTER — Other Ambulatory Visit: Payer: Self-pay | Admitting: Family Medicine

## 2016-03-26 ENCOUNTER — Telehealth: Payer: Self-pay | Admitting: Family Medicine

## 2016-03-26 DIAGNOSIS — K5909 Other constipation: Secondary | ICD-10-CM

## 2016-03-26 NOTE — Telephone Encounter (Signed)
Xray really showed hardly any stool in colon----  He should use miralax and stool softener And drink plenty of water

## 2016-03-26 NOTE — Telephone Encounter (Signed)
Spoke with patient's wife first and she thinks his issues are all in his head, she said that he is not eating, yesterday is the first day he had a meal since last Wednesday, she feels like he is obsessing, she has been trying to get him to take the OTC medication but he will not, She wanted to know if we could ref to GI for a colonoscopy so he can see that there is nothing wrong with him, she put's the patient on the phone and he stated that he has no BM and he ate 3 meals yesterday, he is wanting to repeat the X-ray to see if the constipation has resolved, she is not having any abdominal pain, but has had pain in his back in the center and on the left side. Wife stated asking him did he want to be committed because he is starting to sound crazy with his crazy symptoms, I made her aware that we do not want to call names and say his symptoms aren't real but we want to get down to the bottom of it, I advised I would send this information to Dr.Lowne and see if she is ok with repeat the X-ray. I made his aware do not take colace or miralax if he is not eating because he will become dehydrated and he said he has drank 60 ounces of water today.     KP

## 2016-03-26 NOTE — Telephone Encounter (Signed)
Please advise      KP 

## 2016-03-26 NOTE — Telephone Encounter (Addendum)
Relation to ZO:XWRUpt:self Call back number:[phone] [facility] [place] - [address] [phone] (Phone) [phone])        Reason for call:  Patient last seen 03/22/16 and was advised by PCP to take  magnesium citrate due to constipation. Patient states he hasn't had bowel. Please advise

## 2016-03-26 NOTE — Telephone Encounter (Signed)
Referral but in for GI --- I do not want to repeat the radiation  We will get colonoscopy I do want him to see a psych for his depression as well---- I think we gave them the list but if not --- please give them the list--- or at least-- [facility] number

## 2016-03-27 ENCOUNTER — Other Ambulatory Visit (INDEPENDENT_AMBULATORY_CARE_PROVIDER_SITE_OTHER): Payer: BLUE CROSS/BLUE SHIELD

## 2016-03-27 ENCOUNTER — Telehealth: Payer: Self-pay | Admitting: Internal Medicine

## 2016-03-27 DIAGNOSIS — E230 Hypopituitarism: Secondary | ICD-10-CM | POA: Diagnosis not present

## 2016-03-27 DIAGNOSIS — E291 Testicular hypofunction: Secondary | ICD-10-CM

## 2016-03-27 LAB — CORTISOL: CORTISOL PLASMA: 12 ug/dL

## 2016-03-27 NOTE — Telephone Encounter (Signed)
Patient has been made aware and he verbalized understanding, he stated he can not have colonoscopy until 04/25/16, I advised he could call and get put on the wait list. He said he is concerned with waiting that long, he at least wanted to see a provider. I advised he can also get put on the wait list for that as well. He verbalized understanding, he said he would give it some thought as well as repeat the X-ray some thought and give me a call back.     KP

## 2016-03-27 NOTE — Telephone Encounter (Signed)
Message left to call the office.    KP 

## 2016-03-27 NOTE — Telephone Encounter (Signed)
Offered pt an appt first available of May 30. Pt states he will see what else he can do, did not want to wait that long for appointment.

## 2016-03-29 ENCOUNTER — Other Ambulatory Visit: Payer: Self-pay | Admitting: Family Medicine

## 2016-03-29 ENCOUNTER — Other Ambulatory Visit: Payer: Self-pay | Admitting: Medical

## 2016-03-29 ENCOUNTER — Ambulatory Visit: Payer: BLUE CROSS/BLUE SHIELD | Admitting: Family Medicine

## 2016-03-29 ENCOUNTER — Encounter: Payer: Self-pay | Admitting: Family Medicine

## 2016-03-29 LAB — TESTOSTERONE,FREE AND TOTAL
TESTOSTERONE FREE: 11.2 pg/mL (ref 7.2–24.0)
Testosterone: 531 ng/dL (ref 348–1197)

## 2016-03-29 MED ORDER — ALPRAZOLAM 0.5 MG PO TABS: ORAL_TABLET | ORAL | Status: DC

## 2016-03-29 NOTE — Telephone Encounter (Signed)
Caller name: Self  Can be reached: [phone]   Reason for call: Refill on Xanax. Never picked up the other medication because he wants to stay on Xanax. Has appt scheduled for Tuesday of next week with Dr. Zola ButtonLowne-Chase

## 2016-03-29 NOTE — Telephone Encounter (Signed)
Refill x1 

## 2016-03-29 NOTE — Telephone Encounter (Signed)
Scheduled appointment w/pt's wife for 4/25

## 2016-03-29 NOTE — Telephone Encounter (Signed)
Rx printed and given to Dr. Lowne for review and signature.   

## 2016-03-29 NOTE — Telephone Encounter (Signed)
Last seen on 03/22/16 and filled 02/21/16 #30  Please advise    KP

## 2016-03-30 ENCOUNTER — Other Ambulatory Visit: Payer: Self-pay | Admitting: Medical

## 2016-03-30 NOTE — Telephone Encounter (Signed)
Patient aware Rx was faxed to the pharmacy.    KP

## 2016-03-30 NOTE — Telephone Encounter (Signed)
Rx faxed.    KP 

## 2016-03-30 NOTE — Telephone Encounter (Signed)
-----   Message from Audelia Actonenise L Smith sent at 03/30/2016  8:22 AM EDT ----- Regarding: Xanax Contact: [phone] Plse call patient about Xanax rx

## 2016-04-03 ENCOUNTER — Ambulatory Visit (HOSPITAL_BASED_OUTPATIENT_CLINIC_OR_DEPARTMENT_OTHER)
Admission: RE | Admit: 2016-04-03 | Discharge: 2016-04-03 | Disposition: A | Discharge status: Home / Self Care | Payer: BLUE CROSS/BLUE SHIELD | Source: Ambulatory Visit | Attending: Family Medicine | Admitting: Family Medicine

## 2016-04-03 ENCOUNTER — Encounter: Payer: Self-pay | Admitting: Family Medicine

## 2016-04-03 ENCOUNTER — Ambulatory Visit (INDEPENDENT_AMBULATORY_CARE_PROVIDER_SITE_OTHER): Payer: BLUE CROSS/BLUE SHIELD | Admitting: Family Medicine

## 2016-04-03 VITALS — BP 140/84 | HR 90 | Temp 98.1°F | Ht 71.0 in | Wt 160.6 lb

## 2016-04-03 DIAGNOSIS — F329 Major depressive disorder, single episode, unspecified: Secondary | ICD-10-CM | POA: Diagnosis not present

## 2016-04-03 DIAGNOSIS — F32A Depression, unspecified: Secondary | ICD-10-CM | POA: Insufficient documentation

## 2016-04-03 DIAGNOSIS — K5901 Slow transit constipation: Secondary | ICD-10-CM | POA: Insufficient documentation

## 2016-04-03 HISTORY — DX: Depression, unspecified: F32.A

## 2016-04-03 NOTE — Progress Notes (Signed)
Pre visit review using our clinic review tool, if applicable. No additional management support is needed unless otherwise documented below in the visit note. 

## 2016-04-03 NOTE — Progress Notes (Signed)
Patient ID: Bruce [name], male    DOB: December 08, 1961  Age: 55 y.o. MRN: [id number]    Subjective:  Subjective HPI Bruce [name] presents with wife c/o constipation.  Pt has abd xray which showed min stool and pt is having small bm s and his wife states he is hardly eating.  Hie has lost over 20 lbs since Dec.   His wife is at her wits end not knowing what to do .  He refuses meds of any kind except the clomid from endo.  They have made an appointment with psych but its not until end of May and this has been going on since before Dec.    Review of Systems  Constitutional: Positive for activity change, appetite change and unexpected weight change. Negative for diaphoresis and fatigue.  Eyes: Negative for pain, redness and visual disturbance.  Respiratory: Negative for cough, chest tightness, shortness of breath and wheezing.   Cardiovascular: Negative for chest pain, palpitations and leg swelling.  Gastrointestinal: Positive for constipation and blood in stool. Negative for nausea, vomiting, abdominal pain, diarrhea, abdominal distention, anal bleeding and rectal pain.  Endocrine: Negative for cold intolerance, heat intolerance, polydipsia, polyphagia and polyuria.  Genitourinary: Negative for dysuria, frequency and difficulty urinating.  Neurological: Negative for dizziness, light-headedness, numbness and headaches.  Psychiatric/Behavioral: Positive for dysphoric mood. Negative for suicidal ideas and self-injury.    History Past Medical History  Diagnosis Date  . Hypercholesteremia   . Vitamin D deficiency   . Depression 04/03/2016    He has past surgical history that includes Tonsillectomy.   His family history includes Heart attack in his father; Hypothyroidism in his mother. There is no history of Other.He reports that he has never smoked. He does not have any smokeless tobacco history on file. He reports that he does not drink alcohol or use illicit drugs.  Current Outpatient  Prescriptions on File Prior to Visit  Medication Sig Dispense Refill  . ALPRAZolam (XANAX) 0.5 MG tablet Take 1 tablet by mouth at bedtime as needed for anxiety or sleep. 30 tablet 0  . clomiPHENE (CLOMID) 50 MG tablet 1/4 tab daily 15 tablet 3   No current facility-administered medications on file prior to visit.     Objective:  Objective Physical Exam  Constitutional: He is oriented to person, place, and time. Vital signs are normal. He appears well-developed and well-nourished. He is sleeping.  HENT:  Head: Normocephalic and atraumatic.  Mouth/Throat: Oropharynx is clear and moist.  Eyes: EOM are normal. Pupils are equal, round, and reactive to light.  Neck: Normal range of motion. Neck supple. No thyromegaly present.  Cardiovascular: Normal rate and regular rhythm.   No murmur heard. Pulmonary/Chest: Effort normal and breath sounds normal. No respiratory distress. He has no wheezes. He has no rales. He exhibits no tenderness.  Abdominal: Soft. Bowel sounds are normal. He exhibits no distension and no mass. There is no tenderness. There is no rebound and no guarding.  Musculoskeletal: He exhibits no edema or tenderness.  Neurological: He is alert and oriented to person, place, and time.  Skin: Skin is warm and dry.  Psychiatric: He has a normal mood and affect. His behavior is normal. Judgment and thought content normal.  Nursing note and vitals reviewed.  BP 140/84 mmHg  Pulse 90  Temp(Src) 98.1 F (36.7 C) (Oral)  Ht  (1.803 m)  Wt 160 lb 9.6 oz (72.848 kg)  BMI 22.41 kg/m2  SpO2 99% Wt Readings from Last 3 Encounters:  04/03/16 160 lb 9.6 oz (72.848 kg)  03/22/16 163 lb 9.6 oz (74.208 kg)  03/12/16 169 lb 3.2 oz (76.749 kg)     Lab Results  Component Value Date   WBC 6.7 11/29/2015   HGB 15.0 11/29/2015   HCT 45.7 11/29/2015   PLT 272.0 11/29/2015   GLUCOSE 102* 11/29/2015   CHOL 232* 11/29/2015   TRIG 220.0* 11/29/2015   HDL 42.10 11/29/2015    LDLDIRECT 146.0 11/29/2015   LDLCALC 84 11/23/2011   ALT 19 11/29/2015   AST 21 11/29/2015   NA 140 11/29/2015   K 4.0 11/29/2015   CL 102 11/29/2015   CREATININE 1.03 11/29/2015   BUN 21 11/29/2015   CO2 30 11/29/2015   TSH 2.54 11/29/2015   PSA 1.16 01/27/2016   HGBA1C 5.5 01/27/2016    Dg Abd 2 Views  03/22/2016  CLINICAL DATA:  Slow transit constipation EXAM: ABDOMEN - 2 VIEW COMPARISON:  None FINDINGS: Lung bases clear. Cardiac silhouette appears enlarged. Minimally prominent stool RIGHT colon. Bowel gas pattern otherwise normal. No bowel dilatation or evidence of obstruction. Bones demineralized. No urine tract calcification. IMPRESSION: Minimally prominent stool RIGHT colon. Otherwise normal exam. Electronically Signed   By: Ulyses SouthwardMark  Boles M.D.   On: 03/22/2016 17:03     Assessment & Plan:  Plan I have discontinued Bruce [name]'s buPROPion, buPROPion, and clonazePAM. I am also having him maintain his clomiPHENE and ALPRAZolam.  No orders of the defined types were placed in this encounter.    Problem List Items Addressed This Visit      Unprioritized   Constipation - Primary   Relevant Orders   DG Abd 2 Views (Completed)   Depression    We will try to get him in with psych-- Dr Donell Beersplovsky sooner Pt is not willing to try any other meds at this time His wife states she will try to get him to take the wellbutrin again with a stool softener if needed msg left with [facility]         Follow-up: Return if symptoms worsen or fail to improve.  Donato SchultzYvonne R Lowne Chase, DO

## 2016-04-03 NOTE — Assessment & Plan Note (Signed)
We will try to get him in with psych-- Dr Donell Beersplovsky sooner Pt is not willing to try any other meds at this time His wife states she will try to get him to take the wellbutrin again with a stool softener if needed msg left with [facility]

## 2016-04-03 NOTE — Patient Instructions (Signed)

## 2016-04-04 NOTE — Telephone Encounter (Signed)
Pt got rx of xanax just recently from Dr. Laury AxonLowne. He wants a refill?. Since recent rx dispensed can't write another rx.  But when this current rx expires and he is out of med/or about to run out within a day or two I can write rx then.

## 2016-04-09 ENCOUNTER — Telehealth: Payer: Self-pay | Admitting: Family Medicine

## 2016-04-09 NOTE — Telephone Encounter (Signed)
Caller name:Gleghorn,Lana Relation to pt: wife  Call back [phone]   Reason for call:  Wife states she hasnt heard from anyone regarding psch appointment and would like to know the location so she can contact office directly. Please advise

## 2016-04-10 NOTE — Telephone Encounter (Signed)
Wife called back today to check the status of the psych referral that was suppose to be sent for patient.

## 2016-04-23 ENCOUNTER — Telehealth: Payer: Self-pay | Admitting: Family Medicine

## 2016-04-24 NOTE — Telephone Encounter (Signed)
Can be reached: [phone] [facility] [place]   Reason for call: pt called to f/u on xanax RX. Has 3 left. Takes 1/day.

## 2016-04-25 NOTE — Telephone Encounter (Addendum)
Spoke with the pt and informed the pt that Dr. Laury AxonLowne is out of the office today and if he could wait for her to return on Thursday to have the refill approved.  Pt stated that he has 3 tablets left and he could wait.  Pt stated that he will give us a call back on tomorrow.  Informed the pt once we have gotten the prescription approved we will sent it to the pharmacy, and he want need to call.   Pt verbalized understanding and agreed.//AB/CMA   Requesting Alprazolam 0.5mg -Take 1 tablet by mouth at bedtime as needed for anxiety or sleep. Last refill:03/29/16;#30,0 Last OV:04/03/16 No ZOX:WRUEAVDS:Needed Please advise.//AB/CMA

## 2016-04-26 MED ORDER — ALPRAZOLAM 0.5 MG PO TABS: ORAL_TABLET | ORAL | Status: DC

## 2016-04-26 NOTE — Telephone Encounter (Signed)
Rx faxed to pharmacy. Fax confirmation received. 

## 2016-04-26 NOTE — Telephone Encounter (Signed)
Rx printed, to PCP for signature. 

## 2016-04-26 NOTE — Addendum Note (Signed)
Addended by: Starla LinkAKLEY, Vela Render J on: 04/26/2016 09:13 AM   Modules accepted: Orders

## 2016-04-26 NOTE — Telephone Encounter (Signed)
Ok to refill x1  1 refill 

## 2016-05-02 ENCOUNTER — Ambulatory Visit (INDEPENDENT_AMBULATORY_CARE_PROVIDER_SITE_OTHER): Payer: BLUE CROSS/BLUE SHIELD | Admitting: Endocrinology

## 2016-05-02 ENCOUNTER — Encounter: Payer: Self-pay | Admitting: Endocrinology

## 2016-05-02 ENCOUNTER — Ambulatory Visit: Payer: BLUE CROSS/BLUE SHIELD | Admitting: Endocrinology

## 2016-05-02 VITALS — BP 114/68 | HR 85 | Temp 98.5°F | Resp 12 | Wt 164.4 lb

## 2016-05-02 DIAGNOSIS — E291 Testicular hypofunction: Secondary | ICD-10-CM | POA: Diagnosis not present

## 2016-05-02 MED ORDER — SILDENAFIL CITRATE 20 MG PO TABS: ORAL_TABLET | ORAL | Status: DC

## 2016-05-02 NOTE — Patient Instructions (Addendum)
Please recheck the blood test in 3 more months.  You can do here, pr at Dr Ernst SpellLowne's office. i have sent a prescription to [facility], for generic viagra. Testosterone treatment has risks, including increased fertility, hair loss, prostate cancer, benign prostate enlargement, blood clots, liver problems, lower hdl ("good cholesterol"), polycythemia (opposite of anemia), sleep apnea, and behavior changes.  Please return in 1 year.

## 2016-05-02 NOTE — Progress Notes (Signed)
   Subjective:    Patient ID: Bruce [name], male    DOB: [birth year], 55 y.o.   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  Since on the clomid, muscle strength and ED sxs persist, but are improved.   Past Medical History  Diagnosis Date  . Hypercholesteremia   . Vitamin D deficiency   . Depression 04/03/2016    Past Surgical History  Procedure Laterality Date  . Tonsillectomy      Social History   Social History  . Marital Status: Married    Spouse Name: N/A  . Number of Children: 4  . Years of Education: N/A   Occupational History  . engineer    Social History Main Topics  . Smoking status: Never Smoker   . Smokeless tobacco: Not on file  . Alcohol Use: No  . Drug Use: No  . Sexual Activity: Yes   Other Topics Concern  . Not on file   Social History Narrative    Current Outpatient Prescriptions on File Prior to Visit  Medication Sig Dispense Refill  . ALPRAZolam (XANAX) 0.5 MG tablet Take 1 tablet by mouth at bedtime as needed for anxiety or sleep. 30 tablet 0  . clomiPHENE (CLOMID) 50 MG tablet 1/4 tab daily 15 tablet 3   No current facility-administered medications on file prior to visit.    No Known Allergies  Family History  Problem Relation Age of Onset  . Heart attack Father   . Hypothyroidism Mother   . Other Neg Hx     hypogonadism    BP 114/68 mmHg  Pulse 85  Temp(Src) 98.5 F (36.9 C) (Oral)  Resp 12  Wt 164 lb 6.4 oz (74.571 kg)  SpO2 97%  Review of Systems Anxiety and insomnia persist, but xanax helps.    Objective:   Physical Exam VITAL SIGNS:  See vs page GENERAL: no distress Ext: no edema.   Lab Results  Component Value Date   TESTOSTERONE 531 03/27/2016      Assessment & Plan:  Hypogonadism: well-controlled ED: persistent despite adequate rx of  hypogonadism  Patient is advised the following: Patient Instructions  Please recheck the blood test in 3 more months.  You can do here, pr at Dr Ernst SpellLowne's office. i have sent a prescription [facility], for generic viagra. Testosterone treatment has risks, including increased fertility, hair loss, prostate cancer, benign prostate enlargement, blood clots, liver problems, lower hdl ("good cholesterol"), polycythemia (opposite of anemia), sleep apnea, and behavior changes.  Please return in 1 year.

## 2016-05-17 ENCOUNTER — Emergency Department (HOSPITAL_COMMUNITY)
Admission: EM | Admit: 2016-05-17 | Discharge: 2016-05-17 | Disposition: A | Discharge status: Home / Self Care | Payer: BLUE CROSS/BLUE SHIELD | Source: Home / Self Care | Attending: Emergency Medicine | Admitting: Emergency Medicine

## 2016-05-17 ENCOUNTER — Emergency Department (HOSPITAL_COMMUNITY)
Admission: EM | Admit: 2016-05-17 | Discharge: 2016-05-18 | Disposition: A | Discharge status: Home / Self Care | Payer: BLUE CROSS/BLUE SHIELD | Attending: Emergency Medicine | Admitting: Emergency Medicine

## 2016-05-17 ENCOUNTER — Encounter (HOSPITAL_COMMUNITY): Payer: Self-pay | Admitting: Emergency Medicine

## 2016-05-17 DIAGNOSIS — Z79899 Other long term (current) drug therapy: Secondary | ICD-10-CM | POA: Insufficient documentation

## 2016-05-17 DIAGNOSIS — F32A Depression, unspecified: Secondary | ICD-10-CM | POA: Diagnosis present

## 2016-05-17 DIAGNOSIS — F22 Delusional disorders: Secondary | ICD-10-CM | POA: Diagnosis present

## 2016-05-17 DIAGNOSIS — F419 Anxiety disorder, unspecified: Secondary | ICD-10-CM | POA: Insufficient documentation

## 2016-05-17 DIAGNOSIS — G4700 Insomnia, unspecified: Secondary | ICD-10-CM

## 2016-05-17 DIAGNOSIS — F411 Generalized anxiety disorder: Secondary | ICD-10-CM | POA: Diagnosis present

## 2016-05-17 DIAGNOSIS — F329 Major depressive disorder, single episode, unspecified: Secondary | ICD-10-CM | POA: Insufficient documentation

## 2016-05-17 DIAGNOSIS — R7989 Other specified abnormal findings of blood chemistry: Secondary | ICD-10-CM

## 2016-05-17 LAB — RAPID URINE DRUG SCREEN, HOSP PERFORMED
AMPHETAMINES: NOT DETECTED
BENZODIAZEPINES: NOT DETECTED
Barbiturates: NOT DETECTED
Cocaine: NOT DETECTED
OPIATES: NOT DETECTED
Tetrahydrocannabinol: NOT DETECTED

## 2016-05-17 LAB — CBC WITH DIFFERENTIAL/PLATELET
BASOS ABS: 0 10*3/uL (ref 0.0–0.1)
BASOS PCT: 1 %
EOS ABS: 0.2 10*3/uL (ref 0.0–0.7)
EOS PCT: 2 %
HCT: 44.7 % (ref 39.0–52.0)
Hemoglobin: 14.7 g/dL (ref 13.0–17.0)
Lymphocytes Relative: 29 %
Lymphs Abs: 2.5 10*3/uL (ref 0.7–4.0)
MCH: 24.9 pg — ABNORMAL LOW (ref 26.0–34.0)
MCHC: 32.9 g/dL (ref 30.0–36.0)
MCV: 75.6 fL — AB (ref 78.0–100.0)
MONO ABS: 0.8 10*3/uL (ref 0.1–1.0)
Monocytes Relative: 9 %
Neutro Abs: 5 10*3/uL (ref 1.7–7.7)
Neutrophils Relative %: 59 %
PLATELETS: 296 10*3/uL (ref 150–400)
RBC: 5.91 MIL/uL — AB (ref 4.22–5.81)
RDW: 14.5 % (ref 11.5–15.5)
WBC: 8.4 10*3/uL (ref 4.0–10.5)

## 2016-05-17 LAB — COMPREHENSIVE METABOLIC PANEL
ALBUMIN: 4.6 g/dL (ref 3.5–5.0)
ALK PHOS: 45 U/L (ref 38–126)
ALT: 24 U/L (ref 17–63)
ANION GAP: 7 (ref 5–15)
AST: 26 U/L (ref 15–41)
BILIRUBIN TOTAL: 0.5 mg/dL (ref 0.3–1.2)
BUN: 22 mg/dL — AB (ref 6–20)
CALCIUM: 9.3 mg/dL (ref 8.9–10.3)
CO2: 29 mmol/L (ref 22–32)
Chloride: 102 mmol/L (ref 101–111)
Creatinine, Ser: 0.93 mg/dL (ref 0.61–1.24)
GFR calc Af Amer: >60 mL/min (ref >60)
GLUCOSE: 88 mg/dL (ref 65–99)
Potassium: 4.2 mmol/L (ref 3.5–5.1)
Sodium: 138 mmol/L (ref 135–145)
TOTAL PROTEIN: 7.2 g/dL (ref 6.5–8.1)

## 2016-05-17 LAB — ACETAMINOPHEN LEVEL: Acetaminophen (Tylenol), Serum: <10 ug/mL — ABNORMAL LOW (ref 10–30)

## 2016-05-17 LAB — SALICYLATE LEVEL

## 2016-05-17 LAB — ETHANOL

## 2016-05-17 MED ORDER — ALPRAZOLAM 0.5 MG PO TABS: 0.5000 mg | ORAL_TABLET | Freq: Every evening | ORAL | Status: DC | PRN

## 2016-05-17 MED ORDER — ACETAMINOPHEN 325 MG PO TABS: 650.0000 mg | ORAL_TABLET | ORAL | Status: DC | PRN

## 2016-05-17 MED ORDER — LORAZEPAM 1 MG PO TABS: 1.0000 mg | ORAL_TABLET | Freq: Three times a day (TID) | ORAL | Status: DC | PRN

## 2016-05-17 MED ORDER — IBUPROFEN 200 MG PO TABS: 600.0000 mg | ORAL_TABLET | Freq: Three times a day (TID) | ORAL | Status: DC | PRN

## 2016-05-17 MED ORDER — CLOMIPHENE CITRATE 50 MG PO TABS: 25.0000 mg | ORAL_TABLET | Freq: Every day | ORAL | Status: DC

## 2016-05-17 MED ORDER — RAMELTEON 8 MG PO TABS: 8.0000 mg | ORAL_TABLET | Freq: Every day | ORAL | Status: DC

## 2016-05-17 MED ORDER — ONDANSETRON HCL 4 MG PO TABS: 4.0000 mg | ORAL_TABLET | Freq: Three times a day (TID) | ORAL | Status: DC | PRN

## 2016-05-17 MED ORDER — RAMELTEON 8 MG PO TABS
8.0000 mg | ORAL_TABLET | Freq: Every day | ORAL | Status: DC
  Administered 2016-05-18: 8 mg via ORAL
  Filled 2016-05-17 (×2): qty 1

## 2016-05-17 NOTE — BH Assessment (Addendum)
Tele Assessment Note   Bruce [name] is an 55 y.o. married male who presents to [facility] ED accompanied by his wife, who participated in assessment. Pt was assessed this afternoon (see note below) and inpatient treatment was recommended. Pt and wife refused and plan was for Pt to return home with medication provided by EDP.   Per wife, Pt went to his mother's house and was very anxious and not behaving normally. Pt had spent the night at his mother's home pacing all night because he believes someone is trying to harm him. He states he has not slept in at least four days. He then abruptly left in his car and refused to return to their home. Pt's wife had to convince Pt to come meet his daughter at her place of employment. Daughter took car keys from Pt and Pt's wife brought him back to the ED. Pt is convinced his wife is conspiring with someone to get him out of the house. Pt  Is too fearful to return home. He says he stopped taking his Xanax three days ago because he believes someone at the pharmacy tampered with the medication.    Pt is now agreeable to being observed in the ED and seeing a psychiatrist in the morning.  From Melynda Ripple on 05/17/16 1426:  "Bruce [name] is an 55 y.o. male with history of Depressive Disorder. Patient presenting with spouse. Patient with "irrational thoughts" and bizarre behaviors x3 weeks. Patient increasingly anxious at night. He paces at night due to fear. Patient expressed that he fears that his spouse will leave him for another man. The spouse is present and sts that that they have a good marriage with no infidelities or issues. Patient with paranoid thoughts thinking that the person his wife is having an affair with is seeing may be hiding in the house. Patient has also mentioned hearing noises in the house. He describes the noises as someone walking or moving around his home. Patient went to his mothers home last night to see if he would be able to relax and get  some sleep. Patient paced all night at his mothers home. He denies SI and HI. Patient denies history of SI, HI, and self mutilating behaviors. Patient denies current stressors. He reports on-going issues with depression but no significant changes currently. Denies alcohol and drug use. Patient has a therapist, Barbie Haggis. No psychiatrist. No history of INPT hospitalization.   Writer discussed potential dispositions for this patient which included (OBS or INPT). Patient and spouse refused this as a option. Sts that they have a son graduating tomorrow. Patient and spouse requesting medications to assist patient with sleeping. Writer discussed with Rosilyn Mings, PA patient's reluctance to participate in any type of overnight stay. Joni Reining agreed to discharge patient with medications to aid in sleeping. Patient's plans to try the medications provided by Joni Reining and if patient doesn't improve she will bring him back to the ER."   Diagnosis: Unspecified Psychotic Disorder  Past Medical History:  Past Medical History  Diagnosis Date  . Hypercholesteremia   . Vitamin D deficiency   . Depression 04/03/2016    Past Surgical History  Procedure Laterality Date  . Tonsillectomy      Family History:  Family History  Problem Relation Age of Onset  . Heart attack Father   . Hypothyroidism Mother   . Other Neg Hx     hypogonadism    Social History:  reports that he has never smoked. He does  not have any smokeless tobacco history on file. He reports that he does not drink alcohol or use illicit drugs.  Additional Social History:  Alcohol / Drug Use Pain Medications: SEE MAR Prescriptions: SEE MAR Over the Counter: SEE MAR History of alcohol / drug use?: No history of alcohol / drug abuse Longest period of sobriety (when/how long): NA  CIWA: CIWA-Ar BP: 120/67 mmHg Pulse Rate: 83 COWS:    PATIENT STRENGTHS: (choose at least two) Ability for insight Average or above average  intelligence Capable of independent living MetallurgistCommunication skills Financial means General fund of knowledge Physical Health Supportive family/friends Work skills  Allergies: No Known Allergies  Home Medications:  (Not in a hospital admission)  OB/GYN Status:  No LMP for male patient.  General Assessment Data Location of Assessment: [facility] ED TTS Assessment: In system Is this a Tele or Face-to-Face Assessment?: Face-to-Face Is this an Initial Assessment or a Re-assessment for this encounter?: Initial Assessment Marital status: Married [place] name: NA Is patient pregnant?: No Pregnancy Status: No Living Arrangements: Spouse/significant other, Children Can pt return to current living arrangement?: Yes Admission Status: Voluntary Is patient capable of signing voluntary admission?: Yes Referral Source: Self/Family/Friend Insurance type: BCBS     Crisis Care Plan Living Arrangements: Spouse/significant other, Children Legal Guardian: Other: (None) Name of Psychiatrist: None (no psychiatrist ) Name of Therapist: Tommy RainwaterRobert Dickenson [facility](Robert Dickinson )  Education Status Is patient currently in school?: No Current Grade: NA Highest grade of school patient has completed: NA (n/a) Name of school: NA (n/a) Contact person: NA (n/a)  Risk to self with the past 6 months Suicidal Ideation: No Has patient been a risk to self within the past 6 months prior to admission? : No Suicidal Intent: No Has patient had any suicidal intent within the past 6 months prior to admission? : No Is patient at risk for suicide?: No Suicidal Plan?: No Has patient had any suicidal plan within the past 6 months prior to admission? : No Access to Means: No What has been your use of drugs/alcohol within the last 12 months?: Pt denies Previous Attempts/Gestures: No How many times?: 0 Other Self Harm Risks: None Triggers for Past Attempts: Other (Comment) (n/a) Intentional Self Injurious Behavior:  None Family Suicide History: No Recent stressful life event(s): Other (Comment) (Child) Persecutory voices/beliefs?: Yes Depression: Yes Depression Symptoms: Feeling angry/irritable, Feeling worthless/self pity, Loss of interest in usual pleasures, Guilt, Fatigue, Isolating, Tearfulness, Insomnia, Despondent Substance abuse history and/or treatment for substance abuse?: No Suicide prevention information given to non-admitted patients: Not applicable  Risk to Others within the past 6 months Homicidal Ideation: No Thoughts of Harm to Others: No Current Homicidal Intent: No Current Homicidal Plan: No Access to Homicidal Means: No Identified Victim: None History of harm to others?: No Assessment of Violence: None Noted Violent Behavior Description: Pt denies history of violence Does patient have access to weapons?: No Criminal Charges Pending?: No Does patient have a court date: No Is patient on probation?: No  Psychosis Hallucinations: Auditory ((patient denies visual; sometimes hears noises (auditory))) Delusions: Persecutory (Pt believes wife is having affair and man is trying to harm )  Mental Status Report Appearance/Hygiene: Unremarkable, Other (Comment) (Well groomed) Eye Contact: Fair Motor Activity: Unremarkable Speech: Logical/coherent Level of Consciousness: Alert Mood: Anxious Affect: Anxious Anxiety Level: Moderate Thought Processes: Coherent, Circumstantial Judgement: Partial Orientation: Person, Place, Time, Situation Obsessive Compulsive Thoughts/Behaviors: Minimal  Cognitive Functioning Concentration: Decreased Memory: Recent Intact, Remote Intact IQ: Average Insight: Fair Impulse Control:  Fair Appetite: Fair Weight Loss: 0 Weight Gain: 0 Sleep: Decreased Total Hours of Sleep: 0 Vegetative Symptoms: None  ADLScreening [facility] Assessment Services) Patient's cognitive ability adequate to safely complete daily activities?: Yes Patient able to express  need for assistance with ADLs?: Yes Independently performs ADLs?: Yes (appropriate for developmental age)  Prior Inpatient Therapy Prior Inpatient Therapy: No Prior Therapy Dates: NA (n/a) Prior Therapy Facilty/Provider(s): NA (n/a) Reason for Treatment: NA (n/a)  Prior Outpatient Therapy Prior Outpatient Therapy: Yes Prior Therapy Dates: Current (current ) Prior Therapy Facilty/Provider(s): Tommy Rainwater [facility]) Reason for Treatment:  (depression, anxiety, stressors) Does patient have an ACCT team?: No Does patient have Intensive In-House Services?  : No Does patient have [facility] services? : No Does patient have P4CC services?: No  ADL Screening (condition at time of admission) Patient's cognitive ability adequate to safely complete daily activities?: Yes Is the patient deaf or have difficulty hearing?: No Does the patient have difficulty seeing, even when wearing glasses/contacts?: No Does the patient have difficulty concentrating, remembering, or making decisions?: No Patient able to express need for assistance with ADLs?: Yes Does the patient have difficulty dressing or bathing?: No Independently performs ADLs?: Yes (appropriate for developmental age) Does the patient have difficulty walking or climbing stairs?: No Weakness of Legs: None Weakness of Arms/Hands: None  Home Assistive Devices/Equipment Home Assistive Devices/Equipment: None    Abuse/Neglect Assessment (Assessment to be complete while patient is alone) Physical Abuse: Denies Verbal Abuse: Denies Sexual Abuse: Denies Exploitation of patient/patient's resources: Denies Self-Neglect: Denies     Merchant navy officer (For Healthcare) Does patient have an advance directive?: No Would patient like information on creating an advanced directive?: No - patient declined information    Additional Information 1:1 In Past 12 Months?: No CIRT Risk: No Elopement Risk: No Does patient have medical  clearance?: Yes     Disposition: Hassie Bruce at [facility], confirmed adult unit is currently at capacity. Gave clinical report to Seward Carol,  NP who said Pt meets criteria for inpatient psychiatric treatment. Notified Dr. Pricilla Loveless of recommendation.  Disposition Initial Assessment Completed for this Encounter: Yes Disposition of Patient: Inpatient treatment program Type of inpatient treatment program: Adult   Pamalee Leyden, [facility], [facility], [facility] Triage Specialist [phone]   Pamalee Leyden 05/17/2016 10:47 PM

## 2016-05-17 NOTE — BH Assessment (Signed)
Writer requested cart setup for TTS Consult and was instructed to call back in 10 minutes to request again once tech may be available.

## 2016-05-17 NOTE — ED Notes (Signed)
Pt was seen and evaluated earlier today for "irrational thoughts and fears" and then left AMA before plan could be discussed. Pt has been struggling with a fear of a man threatening to hurt or kill him in his home x 4 days. Pt sts the fear stems from his wife's other partner who he believes will hurt him. This RN unsure if wife's other partner is real or imagined. Pt has hx of depression and recently stopped taking his xanax. Pt was seen by psych counselor earlier today and then left. Pt sts they left because his daughters graduation is tomorrow and he believed he could tough it out until then. Once pt left, he and his wife went to mother's house to get his stuff (pt had been staying at [place] house to escape fears, however fears continued to manifest themselves there as well.) After pt got stuff, pt got into car without wife and drove off with door open. Wife sts that she had to bribe pt to get him to meet her and then they both came back here. Pt A&Ox4 and ambulatory. Wife sts pt hasn't slept in 4 days.

## 2016-05-17 NOTE — BH Assessment (Addendum)
Assessment Note  Bruce [name] is an 55 y.o. male with history of Depressive Disorder. Patient presenting with spouse. Patient with "irrational thoughts" and bizarre behaviors x3 weeks. Patient increasingly anxious at night. He paces at night due to fear. Patient expressed that he fears that his spouse will leave him for another man. The spouse is present and sts that that they have a good marriage with no infidelities or issues. Patient with paranoid thoughts thinking that the person his wife is having an affair with is seeing may be hiding in the house. Patient has also mentioned hearing noises in the house. He describes the noises as someone walking or moving around his home. Patient went to his mothers home last night to see if he would be able to relax and get some sleep. Patient paced all night at his mothers home. He denies SI and HI. Patient denies history of SI, HI, and self mutilating behaviors. Patient denies current stressors. He reports on-going issues with depression but no significant changes currently. Denies alcohol and drug use. Patient has a therapist, Barbie Haggis. No psychiatrist. No history of INPT hospitalization.   Writer discussed potential dispositions for this patient which included (OBS or INPT). Patient and spouse refused this as a option. Sts that they have a son graduating tomorrow. Patient and spouse requesting medications to assist patient with sleeping. Writer discussed with Rosilyn Mings, PA patient's reluctance to participate in any type of overnight stay. Joni Reining agreed to discharge patient with medications to aid in sleeping. Patient's plans to try the medications provided by Joni Reining and if patient doesn't improve she will bring him back to the ER.   Diagnosis: Anxiety Disorder and Depressive Disorder.   Past Medical History:  Past Medical History  Diagnosis Date  . Hypercholesteremia   . Vitamin D deficiency   . Depression 04/03/2016    Past Surgical History   Procedure Laterality Date  . Tonsillectomy      Family History:  Family History  Problem Relation Age of Onset  . Heart attack Father   . Hypothyroidism Mother   . Other Neg Hx     hypogonadism    Social History:  reports that he has never smoked. He does not have any smokeless tobacco history on file. He reports that he does not drink alcohol or use illicit drugs.  Additional Social History:  Alcohol / Drug Use Pain Medications: SEE MAR Prescriptions: SEE MAR Over the Counter: SEE MAR History of alcohol / drug use?: No history of alcohol / drug abuse  CIWA: CIWA-Ar BP: 134/71 mmHg Pulse Rate: 83 COWS:    Allergies: No Known Allergies  Home Medications:  (Not in a hospital admission)  OB/GYN Status:  No LMP for male patient.  General Assessment Data [facility] of Assessment: [facility] TTS Assessment: In system Is this a Tele or Face-to-Face Assessment?: Face-to-Face Is this an Initial Assessment or a Re-assessment for this encounter?: Initial Assessment Marital status: Single Maiden name:  (n/a) Is patient pregnant?: No Pregnancy Status: No Living Arrangements: Spouse/significant other, Children Can pt return to current living arrangement?: Yes Admission Status: Voluntary Is patient capable of signing voluntary admission?: No Referral Source: Self/Family/Friend Insurance type:  Herbalist)  Medical Screening Exam [facility] Walk-in ONLY) Medical Exam completed: No  Crisis Care Plan Living Arrangements: Spouse/significant other, Children Legal Guardian:  (no guardian ) Name of Psychiatrist:  (no psychiatrist ) Name of Therapist:  Barbie Haggis )  Education Status Is patient currently in school?: No Current Grade:  (  n/a) Highest grade of school patient has completed:  (n/a) Name of school:  (n/a) Contact person:  (n/a)  Risk to self with the past 6 months Suicidal Ideation: No Has patient been a risk to self within the past 6 months prior to admission? :  No Suicidal Intent: No Has patient had any suicidal intent within the past 6 months prior to admission? : No Is patient at risk for suicide?: No Suicidal Plan?: No Has patient had any suicidal plan within the past 6 months prior to admission? : No Access to Means: No What has been your use of drugs/alcohol within the last 12 months?:  (patient is calm and cooperative ) Previous Attempts/Gestures: No How many times?:  (0) Other Self Harm Risks:  (n/a) Triggers for Past Attempts: Other (Comment) (n/a) Intentional Self Injurious Behavior: None Family Suicide History: No Recent stressful life event(s):  (patient ) Persecutory voices/beliefs?: No Depression: Yes Depression Symptoms: Feeling angry/irritable, Feeling worthless/self pity, Loss of interest in usual pleasures, Guilt, Isolating, Fatigue, Tearfulness, Insomnia, Despondent Substance abuse history and/or treatment for substance abuse?: No Suicide prevention information given to non-admitted patients: Not applicable  Risk to Others within the past 6 months Homicidal Ideation: No Does patient have any lifetime risk of violence toward others beyond the six months prior to admission? : No Thoughts of Harm to Others: No Current Homicidal Intent: No Current Homicidal Plan: No Access to Homicidal Means: No Identified Victim:  (n/a) History of harm to others?: No Assessment of Violence: None Noted Violent Behavior Description:  (patient is calm and coopertive ) Does patient have access to weapons?: No Criminal Charges Pending?: No Does patient have a court date: No Is patient on probation?: No  Psychosis Hallucinations: Auditory (patient denies visual; sometimes hears noises (auditory)) Delusions:  (paranoid that wife will leave him for another man )  Mental Status Report Appearance/Hygiene: Unremarkable Eye Contact: Fair Motor Activity: Restlessness Speech: Logical/coherent Level of Consciousness: Alert Mood:  Anxious Affect: Appropriate to circumstance, Anxious Anxiety Level: Moderate Thought Processes: Relevant, Circumstantial Judgement: Other (Comment) (fair ) Orientation: Person, Place, Time, Situation Obsessive Compulsive Thoughts/Behaviors: None  Cognitive Functioning Concentration: Decreased Memory: Recent Intact, Remote Intact IQ: Average Insight: Fair Impulse Control: Fair Appetite: Fair Weight Loss:  (patient loss wt. from taking Wellbutrin ) Weight Gain:  (n/a) Sleep: Decreased Total Hours of Sleep:  (patient pacing at night x3 nights) Vegetative Symptoms: None  ADLScreening [facility] Assessment Services) Patient's cognitive ability adequate to safely complete daily activities?: Yes Patient able to express need for assistance with ADLs?: Yes Independently performs ADLs?: Yes (appropriate for developmental age)  Prior Inpatient Therapy Prior Inpatient Therapy: No Prior Therapy Dates:  (n/a) Prior Therapy Facilty/Provider(s):  (n/a) Reason for Treatment:  (n/a)  Prior Outpatient Therapy Prior Outpatient Therapy: Yes Prior Therapy Dates:  (current ) Prior Therapy Facilty/Provider(s):  Barbie Haggis) Reason for Treatment:  (depression, anxiety, stressors) Does patient have an ACCT team?: No Does patient have Intensive In-House Services?  : No Does patient have [facility] services? : No Does patient have P4CC services?: No  ADL Screening (condition at time of admission) Patient's cognitive ability adequate to safely complete daily activities?: Yes Is the patient deaf or have difficulty hearing?: No Does the patient have difficulty seeing, even when wearing glasses/contacts?: No Does the patient have difficulty concentrating, remembering, or making decisions?: No Patient able to express need for assistance with ADLs?: Yes Does the patient have difficulty dressing or bathing?: No Independently performs ADLs?: Yes (appropriate for developmental  age) Does the patient have  difficulty walking or climbing stairs?: No Weakness of Legs: None Weakness of Arms/Hands: None  Home Assistive Devices/Equipment Home Assistive Devices/Equipment: None    Abuse/Neglect Assessment (Assessment to be complete while patient is alone) Physical Abuse: Denies Verbal Abuse: Denies Sexual Abuse: Denies Exploitation of patient/patient's resources: Denies Self-Neglect: Denies Values / Beliefs Cultural Requests During Hospitalization: None Spiritual Requests During Hospitalization: None   Advance Directives (For Healthcare) Does patient have an advance directive?: No Would patient like information on creating an advanced directive?: No - patient declined information Nutrition Screen- [facility] Patient's home diet: Regular  Additional Information 1:1 In Past 12 Months?: No CIRT Risk: No Elopement Risk: No Does patient have medical clearance?: Yes     Disposition:    Discharge home per Melburn HakeNicole Nadeau, NP with outpatient referrals for psychiatrist and medications.  On Site Evaluation by:   Reviewed with Physician:    Melynda Rippleerry, Max [facility] 05/17/2016 2:36 PM

## 2016-05-17 NOTE — Discharge Instructions (Signed)
Take your medications as prescribed. Follow-up with your primary care provider early next week for follow-up regarding further management of your anxiety and insomnia. Please return to the Emergency Department if symptoms worsen or new onset of fever, chest pain, difficulty breathing, suicidal ideation, homicidal ideation, visual/auditory hallucinations.

## 2016-05-17 NOTE — ED Provider Notes (Signed)
CSN: [id number]     Arrival date & time 05/17/16  1923 History   First MD Initiated Contact with Patient 05/17/16 2051     Chief Complaint  Patient presents with  . Medical Clearance     (Consider location/radiation/quality/duration/timing/severity/associated sxs/prior Treatment) HPI  55 year old male here with his wife for anxiety and paranoia. Patient was just here and evaluated by TTS. Was recommended to be observed overnight versus an admission. However patient and wife stated he needed to leave to go to his son's graduation tomorrow. Shortly after leaving, he could not go home because of the paranoia. He feels like there is someone waiting in his house ready to hurt him. He states this person is related to his wife and that she is cheating on him with him. However the wife states this is not true. Last night he stayed in his mom's house and stood at the door all night because he is paranoid that someone was going to come in to attack him. Has been dealing with depression and eating issues for the past several months. Denies suicidal thoughts.  Past Medical History  Diagnosis Date  . Hypercholesteremia   . Vitamin D deficiency   . Depression 04/03/2016   Past Surgical History  Procedure Laterality Date  . Tonsillectomy     Family History  Problem Relation Age of Onset  . Heart attack Father   . Hypothyroidism Mother   . Other Neg Hx     hypogonadism   Social History  Substance Use Topics  . Smoking status: Never Smoker   . Smokeless tobacco: None  . Alcohol Use: No    Review of Systems  Psychiatric/Behavioral: Positive for sleep disturbance. Negative for suicidal ideas and self-injury. The patient is nervous/anxious.   All other systems reviewed and are negative.     Allergies  Review of patient's allergies indicates no known allergies.  Home Medications   Prior to Admission medications   Medication Sig Start Date End Date Taking? Authorizing Provider  ALPRAZolam  Prudy Feeler) 0.5 MG tablet Take 1 tablet by mouth at bedtime as needed for anxiety or sleep. 04/26/16  Yes Lelon Perla Chase, DO  clomiPHENE (CLOMID) 50 MG tablet 1/4 tab daily Patient taking differently: Take 12.5 mg by mouth daily. 1/4 tab daily 02/24/16  Yes Romero Belling, MD  ramelteon (ROZEREM) 8 MG tablet Take 1 tablet (8 mg total) by mouth at bedtime. 05/17/16   Barrett Henle, PA-C  sildenafil (REVATIO) 20 MG tablet 1-5 pills, as needed for ED symptoms Patient not taking: Reported on 05/17/2016 05/02/16   Romero Belling, MD   BP 120/67 mmHg  Pulse 83  Temp(Src) 98.1 F (36.7 C) (Oral)  Resp 16  SpO2 99% Physical Exam  Constitutional: He is oriented to person, place, and time. He appears well-developed and well-nourished.  HENT:  Head: Normocephalic and atraumatic.  Right Ear: External ear normal.  Left Ear: External ear normal.  Nose: Nose normal.  Eyes: Right eye exhibits no discharge. Left eye exhibits no discharge.  Pulmonary/Chest: Effort normal.  Abdominal: He exhibits no distension.  Musculoskeletal: He exhibits no edema.  Neurological: He is alert and oriented to person, place, and time.  Skin: Skin is warm and dry.  Psychiatric: Thought content is paranoid.  Nursing note and vitals reviewed.   ED Course  Procedures (including critical care time) Labs Review Labs Reviewed - No data to display  Imaging Review No results found. I have personally reviewed and evaluated these images  and lab results as part of my medical decision-making.   EKG Interpretation None      MDM   Final diagnoses:  Paranoia [facility](HCC)    Patient was medically screened earlier today. I do not think further lab work would be beneficial. TTS will be reconsulted and patient will be dispositioned by psych. Currently is well-appearing and calm and resting comfortably. Does not appear to need any acute intervention.    Pricilla LovelessScott Shaila Gilchrest, MD 05/18/16 [phone]

## 2016-05-17 NOTE — ED Provider Notes (Signed)
CSN: [id number]     Arrival date & time 05/17/16  1143 History   First MD Initiated Contact with Patient 05/17/16 1206     Chief Complaint  Patient presents with  . Medical Clearance     (Consider location/radiation/quality/duration/timing/severity/associated sxs/prior Treatment) HPI   Patient is a 55 year old male past medical history of depression who presents to the ED with his wife with complaints of "irrational thoughts". Patient reports over the past few days he has felt more anxious and has been having difficulty sleeping. He reports having a fear that his wife is going to leave him. Patient's wife reports that they have discussed his fear and she does not understand why he is still anxious regarding this issue. Wife reports patient has had difficulty sleeping at night. She notes last night while he was staying at his mother's house, he was walking in and out of the front door due to thinking that someone was outside of the house. Patient denies visual or auditory hallucinations. Denies SI or HI. Denies alcohol or drug use. He reports he was taking Xanax at home for his anxiety and to help with his sleeping however he notes he stopped taking the medication earlier this week due to not wanting to be dependent on medication. Patient denies any pain or complaints at this time.  Past Medical History  Diagnosis Date  . Hypercholesteremia   . Vitamin D deficiency   . Depression 04/03/2016   Past Surgical History  Procedure Laterality Date  . Tonsillectomy     Family History  Problem Relation Age of Onset  . Heart attack Father   . Hypothyroidism Mother   . Other Neg Hx     hypogonadism   Social History  Substance Use Topics  . Smoking status: Never Smoker   . Smokeless tobacco: None  . Alcohol Use: No    Review of Systems  Psychiatric/Behavioral: The patient is nervous/anxious.   All other systems reviewed and are negative.     Allergies  Review of patient's allergies  indicates no known allergies.  Home Medications   Prior to Admission medications   Medication Sig Start Date End Date Taking? Authorizing Provider  ALPRAZolam Prudy Feeler(XANAX) 0.5 MG tablet Take 1 tablet by mouth at bedtime as needed for anxiety or sleep. 04/26/16  Yes Lelon PerlaYvonne R Lowne Chase, DO  clomiPHENE (CLOMID) 50 MG tablet 1/4 tab daily Patient taking differently: Take 12.5 mg by mouth daily. 1/4 tab daily 02/24/16  Yes Romero BellingSean Ellison, MD  ramelteon (ROZEREM) 8 MG tablet Take 1 tablet (8 mg total) by mouth at bedtime. 05/17/16   Barrett HenleNicole Elizabeth Nadeau, PA-C  sildenafil (REVATIO) 20 MG tablet 1-5 pills, as needed for ED symptoms Patient not taking: Reported on 05/17/2016 05/02/16   Romero BellingSean Ellison, MD   BP 132/82 mmHg  Pulse 86  Temp(Src) 98.2 F (36.8 C) (Oral)  Resp 18  SpO2 100% Physical Exam  Constitutional: He is oriented to person, place, and time. He appears well-developed and well-nourished.  HENT:  Head: Normocephalic and atraumatic.  Mouth/Throat: Oropharynx is clear and moist. No oropharyngeal exudate.  Eyes: Conjunctivae and EOM are normal. Pupils are equal, round, and reactive to light. Right eye exhibits no discharge. Left eye exhibits no discharge. No scleral icterus.  Neck: Normal range of motion. Neck supple.  Cardiovascular: Normal rate, regular rhythm, normal heart sounds and intact distal pulses.   Pulmonary/Chest: Effort normal and breath sounds normal. No respiratory distress. He has no wheezes. He has no rales.  He exhibits no tenderness.  Abdominal: Soft. Bowel sounds are normal. He exhibits no distension and no mass. There is no tenderness. There is no rebound and no guarding.  Musculoskeletal: Normal range of motion. He exhibits no edema.  Neurological: He is alert and oriented to person, place, and time.  Skin: Skin is warm and dry.  Psychiatric: His speech is delayed. He is slowed. Cognition and memory are normal. He exhibits a depressed mood. He expresses no homicidal and  no suicidal ideation.  Nursing note and vitals reviewed.   ED Course  Procedures (including critical care time) Labs Review Labs Reviewed  COMPREHENSIVE METABOLIC PANEL - Abnormal; Notable for the following:    BUN 22 (*)    All other components within normal limits  CBC WITH DIFFERENTIAL/PLATELET - Abnormal; Notable for the following:    RBC 5.91 (*)    MCV 75.6 (*)    MCH 24.9 (*)    All other components within normal limits  ACETAMINOPHEN LEVEL - Abnormal; Notable for the following:    Acetaminophen (Tylenol), Serum <10 (*)    All other components within normal limits  ETHANOL  URINE RAPID DRUG SCREEN, HOSP PERFORMED  SALICYLATE LEVEL    Imaging Review No results found. I have personally reviewed and evaluated these images and lab results as part of my medical decision-making.   EKG Interpretation None      MDM   Final diagnoses:  Insomnia  Anxiety    Pt presents with worsening anxiety and insomnia. He reports he stopped taking his Xanax a few days ago. Wife reports pt with irrational thoughts over the past few days. Denies SI, HI, auditory or visual hallucinations. Hx of anxiety and depression. VSS. On exam, pt appeared depressed with slowed behavior. Pt medically cleared. Consulted TTS. I spoke with behavior health who offered pt to stay for observation however pt and wife declined due to having a graduation and family event they want to attend tomorrow through the weekend. On reevaluation, I discussed results and plan for outpatient follow up with pt. Plan to d/c pt home with medication for insomnia but advise pt to follow up with his PCP tomorrow and establish outpatient follow up with psych. Pt reports understanding and agreement. Discussed strict return precautions with pt.     Satira Sark [place], [place] 05/17/16 1611  Melene Plan, DO 05/17/16 1657

## 2016-05-17 NOTE — ED Notes (Signed)
Patient brought in by wife with complaints of "irrational thoughts". States that he has thoughts of fears that often keep him up at night. Denies SI/HI. Denies drug use.

## 2016-05-17 NOTE — ED Notes (Signed)
Bed: [id number] Expected date: 05/17/16 Expected time: 11:44 PM Means of arrival:  Comments: Bruce [name] Z

## 2016-05-18 ENCOUNTER — Telehealth: Payer: Self-pay | Admitting: Family Medicine

## 2016-05-18 DIAGNOSIS — F411 Generalized anxiety disorder: Secondary | ICD-10-CM | POA: Diagnosis not present

## 2016-05-18 DIAGNOSIS — R7989 Other specified abnormal findings of blood chemistry: Secondary | ICD-10-CM

## 2016-05-18 MED ORDER — ALPRAZOLAM 0.5 MG PO TABS: ORAL_TABLET | ORAL | Status: DC

## 2016-05-18 NOTE — Telephone Encounter (Signed)
Refill x1 

## 2016-05-18 NOTE — Telephone Encounter (Signed)
Caller: Lana Relation to pt: wife Can be reached: [phone] [facility] [place]  Reason for call: pt wife called stating pt has misplaced last RX of xanax. It isn't in the home or pts mothers home. He was on a trip and thinks it was misplaced. She states pt is being dc from the hospital w/in the hour and wants to pick up RX on the way home. She stated she would like it sent in asap within the hour. Advised her that we have no guarantee of what time RX is sent/called to pharmacy. She said she will call at 1:00pm to check. Advised her to contact pharmacy to check on RX and again told her no guarantee on time/approval of Rx. She stated she wanted call from Selena BattenKim stating this is urgent.

## 2016-05-18 NOTE — Telephone Encounter (Signed)
Detailed message left on the VM of the pharmacy because no one would pick up the line, VM left for Lana to make her aware.   KP

## 2016-05-18 NOTE — Discharge Instructions (Signed)
For your ongoing behavioral health needs, you are advised to continue treatment with Barbie Haggisobert Dickinson, PhD, your current provider.  Consider asking him if you would benefit from seeing a psychiatrist as well at this time.       Barbie Haggisobert Dickinson, PhD      [address]      [place], [place] [address]      [phone], [phone]

## 2016-05-18 NOTE — Consult Note (Signed)
[place] [facility] Consult   Reason for Consult:  Depression   Referring Physician:  ED Provider Patient Identification: Ajax Schroll MRN:  [id number] Principal Diagnosis: Generalized anxiety disorder Diagnosis:   Patient Active Problem List   Diagnosis Date Noted  . Generalized anxiety disorder [F41.1] 11/28/2015    Priority: High  . Low testosterone [E29.1] 05/18/2016  . Depression [F32.9] 04/03/2016  . Constipation [K59.00] 03/22/2016  . Insomnia [G47.00] 03/12/2016  . 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 Hypogonadism, male [E29.1] 02/17/2016  . Fatigue [R53.83] 11/28/2015  . Erectile dysfunction [N52.9] 11/28/2015  . Decreased libido [R68.82] 11/28/2015  . Snoring [R06.83] 11/28/2015  . Hypercholesteremia [E78.00]   . Vitamin D deficiency [E55.9]     Total Time spent with patient: 45 minutes  Subjective:   Skye Rodarte is a 55 y.o. male patient admitted with depression and anxiety  HPI:  He presented to [facility] with reports of paranoia.  He was accompanied by his wife.  It was reported that the patient had not been acting himself in the last few days.  He felt that his wife was having an affair.  "I can see over time, she has changed.  He is dressing up more and I smell other peoples colognes."  The patient reports that he has low testosterone and is on Clomid by his PCP.  He also states that he is seeing a psychotherapist Konrad Dolores for his anxiety.  He denies SI and HI.  He denies paranoia.  His wife is at bedside.    Very supportive.  They have 4 children.   Past Psychiatric History:  Depression.  Was on Wellbutrin but patient stopped taking due to side effects.  Risk to Self: Suicidal Ideation: No Suicidal Intent: No Is patient at risk for suicide?: No Suicidal Plan?: No Access to Means: No What has been your use of drugs/alcohol within the last 12 months?: Pt denies How many times?: 0 Other Self Harm Risks: None Triggers for Past Attempts:  Other (Comment) (n/a) Intentional Self Injurious Behavior: None Risk to Others: Homicidal Ideation: No Thoughts of Harm to Others: No Current Homicidal Intent: No Current Homicidal Plan: No Access to Homicidal Means: No Identified Victim: None History of harm to others?: No Assessment of Violence: None Noted Violent Behavior Description: Pt denies history of violence Does patient have access to weapons?: No Criminal Charges Pending?: No Does patient have a court date: No Prior Inpatient Therapy: Prior Inpatient Therapy: No Prior Therapy Dates: NA (n/a) Prior Therapy Facilty/Provider(s): NA (n/a) Reason for Treatment: NA (n/a) Prior Outpatient Therapy: Prior Outpatient Therapy: Yes Prior Therapy Dates: Current (current ) Prior Therapy Facilty/Provider(s): [facility] [employer]) Reason for Treatment:  (depression, anxiety, stressors) Does patient have an ACCT team?: No Does patient have Intensive In-House Services?  : No Does patient have [facility] services? : No Does patient have P4CC services?: No  Past Medical History:  Past Medical History  Diagnosis Date  . Hypercholesteremia   . Vitamin D deficiency   . Depression 04/03/2016    Past Surgical History  Procedure Laterality Date  . Tonsillectomy     Family History:  Family History  Problem Relation Age of Onset  . Heart attack Father   . Hypothyroidism Mother   . Other Neg Hx     hypogonadism   Family Psychiatric  History: see HPI Social History:  History  Alcohol Use No     History  Drug Use No    Social History  Social History  . Marital Status: Married    Spouse Name: N/A  . Number of Children: 4  . Years of Education: N/A   Occupational History  . engineer    Social History Main Topics  . Smoking status: Never Smoker   . Smokeless tobacco: None  . Alcohol Use: No  . Drug Use: No  . Sexual Activity: Yes   Other Topics Concern  . None   Social History Narrative   Additional  Social History:    Allergies:  No Known Allergies  Labs:  Results for orders placed or performed during the hospital encounter of 05/17/16 (from the past 48 hour(s))  Comprehensive metabolic panel     Status: Abnormal   Collection Time: 05/17/16 12:51 PM  Result Value Ref Range   Sodium 138 135 - 145 mmol/L   Potassium 4.2 3.5 - 5.1 mmol/L   Chloride 102 101 - 111 mmol/L   CO2 29 22 - 32 mmol/L   Glucose, Bld 88 65 - 99 mg/dL   BUN 22 (H) 6 - 20 mg/dL   Creatinine, Ser 0.93 0.61 - 1.24 mg/dL   Calcium 9.3 8.9 - 10.3 mg/dL   Total Protein 7.2 6.5 - 8.1 g/dL   Albumin 4.6 3.5 - 5.0 g/dL   AST 26 15 - 41 U/L   ALT 24 17 - 63 U/L   Alkaline Phosphatase 45 38 - 126 U/L   Total Bilirubin 0.5 0.3 - 1.2 mg/dL   GFR calc non Af Amer >60 >60 mL/min   GFR calc Af Amer >60 >60 mL/min    Comment: (NOTE) The eGFR has been calculated using the CKD EPI equation. This calculation has not been validated in all clinical situations. eGFR's persistently <60 mL/min signify possible Chronic Kidney Disease.    Anion gap 7 5 - 15  Ethanol     Status: None   Collection Time: 05/17/16 12:51 PM  Result Value Ref Range   Alcohol, Ethyl (B) <5 <5 mg/dL    Comment:        LOWEST DETECTABLE LIMIT FOR SERUM ALCOHOL IS 5 mg/dL FOR MEDICAL PURPOSES ONLY   CBC with Diff     Status: Abnormal   Collection Time: 05/17/16 12:51 PM  Result Value Ref Range   WBC 8.4 4.0 - 10.5 K/uL   RBC 5.91 (H) 4.22 - 5.81 MIL/uL   Hemoglobin 14.7 13.0 - 17.0 g/dL   HCT 44.7 39.0 - 52.0 %   MCV 75.6 (L) 78.0 - 100.0 fL   MCH 24.9 (L) 26.0 - 34.0 pg   MCHC 32.9 30.0 - 36.0 g/dL   RDW 14.5 11.5 - 15.5 %   Platelets 296 150 - 400 K/uL   Neutrophils Relative % 59 %   Neutro Abs 5.0 1.7 - 7.7 K/uL   Lymphocytes Relative 29 %   Lymphs Abs 2.5 0.7 - 4.0 K/uL   Monocytes Relative 9 %   Monocytes Absolute 0.8 0.1 - 1.0 K/uL   Eosinophils Relative 2 %   Eosinophils Absolute 0.2 0.0 - 0.7 K/uL   Basophils Relative 1 %    Basophils Absolute 0.0 0.0 - 0.1 K/uL  Salicylate level     Status: None   Collection Time: 05/17/16 12:51 PM  Result Value Ref Range   Salicylate Lvl <8.0 2.8 - 30.0 mg/dL  Acetaminophen level     Status: Abnormal   Collection Time: 05/17/16 12:51 PM  Result Value Ref Range   Acetaminophen (Tylenol), Serum <10 (L)  10 - 30 ug/mL    Comment:        THERAPEUTIC CONCENTRATIONS VARY SIGNIFICANTLY. A RANGE OF 10-30 ug/mL MAY BE AN EFFECTIVE CONCENTRATION FOR MANY PATIENTS. HOWEVER, SOME ARE BEST TREATED AT CONCENTRATIONS OUTSIDE THIS RANGE. ACETAMINOPHEN CONCENTRATIONS >150 ug/mL AT 4 HOURS AFTER INGESTION AND >50 ug/mL AT 12 HOURS AFTER INGESTION ARE OFTEN ASSOCIATED WITH TOXIC REACTIONS.   Urine rapid drug screen (hosp performed)not at [facility]     Status: None   Collection Time: 05/17/16  2:47 PM  Result Value Ref Range   Opiates NONE DETECTED NONE DETECTED   Cocaine NONE DETECTED NONE DETECTED   Benzodiazepines NONE DETECTED NONE DETECTED   Amphetamines NONE DETECTED NONE DETECTED   Tetrahydrocannabinol NONE DETECTED NONE DETECTED   Barbiturates NONE DETECTED NONE DETECTED    Comment:        DRUG SCREEN FOR MEDICAL PURPOSES ONLY.  IF CONFIRMATION IS NEEDED FOR ANY PURPOSE, NOTIFY LAB WITHIN 5 DAYS.        LOWEST DETECTABLE LIMITS FOR URINE DRUG SCREEN Drug Class       Cutoff (ng/mL) Amphetamine      1000 Barbiturate      200 Benzodiazepine   916 Tricyclics       384 Opiates          300 Cocaine          300 THC              50     Current Facility-Administered Medications  Medication Dose Route Frequency Provider Last Rate Last Dose  . acetaminophen (TYLENOL) tablet 650 mg  650 mg Oral Q4H PRN Sherwood Gambler, MD      . ALPRAZolam Duanne Moron) tablet 0.5 mg  0.5 mg Oral QHS PRN Sherwood Gambler, MD      . clomiPHENE (CLOMID) tablet 25 mg  25 mg Oral Daily Sherwood Gambler, MD   25 mg at 05/18/16 1105  . ibuprofen (ADVIL,MOTRIN) tablet 600 mg  600 mg Oral Q8H PRN Sherwood Gambler, MD      . LORazepam (ATIVAN) tablet 1 mg  1 mg Oral Q8H PRN Sherwood Gambler, MD      . ondansetron [facility]) tablet 4 mg  4 mg Oral Q8H PRN Sherwood Gambler, MD      . ramelteon (ROZEREM) tablet 8 mg  8 mg Oral QHS Sherwood Gambler, MD   8 mg at 05/18/16 0021   Current Outpatient Prescriptions  Medication Sig Dispense Refill  . ALPRAZolam (XANAX) 0.5 MG tablet Take 1 tablet by mouth at bedtime as needed for anxiety or sleep. 30 tablet 0  . clomiPHENE (CLOMID) 50 MG tablet 1/4 tab daily (Patient taking differently: Take 12.5 mg by mouth daily. 1/4 tab daily) 15 tablet 3  . ramelteon (ROZEREM) 8 MG tablet Take 1 tablet (8 mg total) by mouth at bedtime. 14 tablet 0  . sildenafil (REVATIO) 20 MG tablet 1-5 pills, as needed for ED symptoms (Patient not taking: Reported on 05/17/2016) 60 tablet 11    Musculoskeletal: Strength & Muscle Tone: within normal limits Gait & Station: normal Patient leans: N/A  Psychiatric Specialty Exam: Physical Exam  Vitals reviewed.   Review of Systems  Psychiatric/Behavioral: Depression: stable.  All other systems reviewed and are negative.   Blood pressure 105/64, pulse 61, temperature 97.8 F (36.6 C), temperature source Oral, resp. rate 18, SpO2 100 %.There is no weight on file to calculate BMI.  General Appearance: Fairly Groomed  Eye Contact:  Fair  Speech:  Clear and Coherent  Volume:  Normal  Mood:  Depressed  Affect:  Depressed and Flat  Thought Process:  Coherent  Orientation:  Full (Time, Place, and Person)  Thought Content:  Logical and Rumination  Suicidal Thoughts:  No  Homicidal Thoughts:  No  Memory:  Immediate;   Good Recent;   Good Remote;   Good  Judgement:  Good  Insight:  Good  Psychomotor Activity:  Normal  Concentration:  Concentration: Good and Attention Span: Good  Recall:  Good  Fund of Knowledge:  Good  Language:  Good  Akathisia:  Negative  Handed:  Right  AIMS (if indicated):     Assets:  Communication  Skills Physical Health Resilience  ADL's:  Intact  Cognition:  WNL  Sleep:  poor   Treatment Plan Summary: Admit for crisis management and mood stabilization. Medication management to re-stabilize current mood symptoms Group counseling sessions for coping skills Medical consults as needed Review and reinstate any pertinent home medications for other health problems   Disposition: No evidence of imminent risk to self or others at present.   Patient does not meet criteria for psychiatric inpatient admission. Supportive therapy provided about ongoing stressors. Discussed crisis plan, support from social network, calling 911, coming to the Emergency Department, and calling Suicide Hotline.  Freda Munro May Agustin, NP 05/18/2016 12:38 PM Patient seen face-to-face for psychiatric evaluation, chart reviewed and case discussed with the physician extender and developed treatment plan. Reviewed the information documented and agree with the treatment plan. Corena Pilgrim, MD

## 2016-05-18 NOTE — BH Assessment (Signed)
[facility] Assessment Progress Note  Per Thedore MinsMojeed Akintayo, MD, this pt does not require psychiatric hospitalization at this time.  [facility] to follow up with Barbie Haggisobert Dickinson, PhD, his outpatient provider.  He is to be advised to discuss potential need for psychiatry with this provider.  These details have been included in pt's discharge instructions.  Pt's nurse, Diane, has been notified.  Doylene Canninghomas Ezella Kell, MA Triage Specialist [phone]

## 2016-05-18 NOTE — ED Notes (Signed)
Pt expressed readiness for discharge. He denied SI/HI and does not appear to be responding to internal stimuli. His wife picked him up. He would no consent for her to receive information about him, so all discussions about his care were done without her present. She was referred to speak with her husband for information. All belongings returned to pt.

## 2016-05-18 NOTE — ED Notes (Signed)
Pt is soft spoken and anxious. He denies SI/HI. He suspects that his wife might be seeing another man based on smelling another man's cologne and she is more interested in how she looks than previously. They have been married for 27 years and she at one time was staying in touch with old boyfriends. He is able to consider that it might not be true.

## 2016-05-18 NOTE — Telephone Encounter (Signed)
Last seen 04/04/15 and filled 04/26/16 #30   Please advise   KP

## 2016-05-18 NOTE — ED Notes (Addendum)
Pt presents with anxiety and insomnia for past 4 days he reports.  Pt states he stopped Xanax x 3 days ago.  Pt will not elaborate on family stressors with wife.  Initial complaint states he was worried she has a lover.  Denies SI, HI or AVH.  Denies feeling hopeless.  AAO x 3, no distress noted, calm & cooperative.  Monitoring for safety, Q 15 min checks in effect.

## 2016-05-18 NOTE — [facility] Suicide Risk Assessment (Cosign Needed Addendum)
Suicide Risk Assessment  Discharge Assessment   [facility]   Principal Problem: Generalized anxiety disorder Discharge Diagnoses:  Patient Active Problem List   Diagnosis Date Noted  . Generalized anxiety disorder [F41.1] 11/28/2015    Priority: High  . Low testosterone [E29.1] 05/18/2016  . Depression [F32.9] 04/03/2016  . Constipation [K59.00] 03/22/2016  . Insomnia [G47.00] 03/12/2016  . 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 Hypogonadism, male [E29.1] 02/17/2016  . Fatigue [R53.83] 11/28/2015  . Erectile dysfunction [N52.9] 11/28/2015  . Decreased libido [R68.82] 11/28/2015  . Snoring [R06.83] 11/28/2015  . Hypercholesteremia [E78.00]   . Vitamin D deficiency [E55.9]     Total Time spent with patient: 30 minutes  Musculoskeletal: Strength & Muscle Tone: within normal limits Gait & Station: normal Patient leans: N/A  Psychiatric Specialty Exam: Physical Exam  Vitals reviewed.   Review of Systems  Psychiatric/Behavioral: Depression: stable.  All other systems reviewed and are negative.   Blood pressure 105/64, pulse 61, temperature 97.8 F (36.6 C), temperature source Oral, resp. rate 18, SpO2 100 %.There is no weight on file to calculate BMI.  General Appearance: Fairly Groomed  Eye Contact:  Fair  Speech:  Clear and Coherent  Volume:  Normal  Mood:  Depressed  Affect:  Depressed and Flat  Thought Process:  Coherent  Orientation:  Full (Time, Place, and Person)  Thought Content:  Logical and Rumination  Suicidal Thoughts:  No  Homicidal Thoughts:  No  Memory:  Immediate;   Good Recent;   Good Remote;   Good  Judgement:  Good  Insight:  Good  Psychomotor Activity:  Normal  Concentration:  Concentration: Good and Attention Span: Good  Recall:  Good  Fund of Knowledge:  Good  Language:  Good  Akathisia:  Negative  Handed:  Right  AIMS (if indicated):     Assets:  Communication Skills Physical Health Resilience   ADL's:  Intact  Cognition:  WNL  Sleep:  poor   Mental Status Per Nursing Assessment::   On Admission:     Demographic Factors:  Male  Loss Factors: NA  Historical Factors: NA  Risk Reduction Factors:   Responsible for children under 54 years of age, Sense of responsibility to family, Employed, Living with another person, especially a relative, Positive social support and Positive therapeutic relationship  Continued Clinical Symptoms:  Panic Attacks Depression:   Delusional  Cognitive Features That Contribute To Risk:  None    Suicide Risk:  Minimal: No identifiable suicidal ideation.  Patients presenting with no risk factors but with morbid ruminations; may be classified as minimal risk based on the severity of the depressive symptoms  Plan Of Care/Follow-up recommendations:  Activity:  as tol Diet:  as tol  Patient will be continuing with follow up appts with Betsey HolidayBob Dickerson.  Advised patient to discuss with Mr Rubye OaksDickerson preferred psychiatrist  [facility] May Matti Killingsworth, NP [facility] 05/18/2016, 1:00 PM

## 2016-05-19 ENCOUNTER — Encounter (HOSPITAL_COMMUNITY): Payer: Self-pay | Admitting: Emergency Medicine

## 2016-05-19 ENCOUNTER — Emergency Department (HOSPITAL_COMMUNITY)
Admission: EM | Admit: 2016-05-19 | Discharge: 2016-05-20 | Disposition: A | Discharge status: Home / Self Care | Payer: BLUE CROSS/BLUE SHIELD | Attending: Emergency Medicine | Admitting: Emergency Medicine

## 2016-05-19 DIAGNOSIS — R443 Hallucinations, unspecified: Secondary | ICD-10-CM | POA: Diagnosis present

## 2016-05-19 DIAGNOSIS — E7800 Pure hypercholesterolemia, unspecified: Secondary | ICD-10-CM | POA: Diagnosis not present

## 2016-05-19 DIAGNOSIS — F32A Depression, unspecified: Secondary | ICD-10-CM

## 2016-05-19 DIAGNOSIS — F329 Major depressive disorder, single episode, unspecified: Secondary | ICD-10-CM | POA: Diagnosis not present

## 2016-05-19 DIAGNOSIS — Z79899 Other long term (current) drug therapy: Secondary | ICD-10-CM | POA: Diagnosis not present

## 2016-05-19 LAB — CBC
HEMATOCRIT: 40.5 % (ref 39.0–52.0)
HEMOGLOBIN: 13.9 g/dL (ref 13.0–17.0)
MCH: 25.2 pg — AB (ref 26.0–34.0)
MCHC: 34.3 g/dL (ref 30.0–36.0)
MCV: 73.5 fL — ABNORMAL LOW (ref 78.0–100.0)
Platelets: 257 10*3/uL (ref 150–400)
RBC: 5.51 MIL/uL (ref 4.22–5.81)
RDW: 14.1 % (ref 11.5–15.5)
WBC: 6.8 10*3/uL (ref 4.0–10.5)

## 2016-05-19 NOTE — ED Notes (Signed)
Pt from home with his wife. Pt has paranoid thoughts of people trying to hurt him in his home. He has hallucinations of people following him and his wife, bugging his house, and sitting in cars outside of their home. Pt has been here 3 times this week for similar symptoms and [facility]. Pt's wife states that pt has a hx of depression, but has never had hallucinations or paranoia until this past week.  Pt denies SI and HI

## 2016-05-20 LAB — COMPREHENSIVE METABOLIC PANEL
ALT: 21 U/L (ref 17–63)
AST: 25 U/L (ref 15–41)
Albumin: 4.3 g/dL (ref 3.5–5.0)
Alkaline Phosphatase: 42 U/L (ref 38–126)
Anion gap: 7 (ref 5–15)
BUN: 22 mg/dL — AB (ref 6–20)
CHLORIDE: 104 mmol/L (ref 101–111)
CO2: 26 mmol/L (ref 22–32)
Calcium: 9.1 mg/dL (ref 8.9–10.3)
Creatinine, Ser: 0.97 mg/dL (ref 0.61–1.24)
Glucose, Bld: 118 mg/dL — ABNORMAL HIGH (ref 65–99)
POTASSIUM: 3.5 mmol/L (ref 3.5–5.1)
SODIUM: 137 mmol/L (ref 135–145)
Total Bilirubin: 0.5 mg/dL (ref 0.3–1.2)
Total Protein: 6.5 g/dL (ref 6.5–8.1)

## 2016-05-20 LAB — SALICYLATE LEVEL

## 2016-05-20 LAB — ACETAMINOPHEN LEVEL: Acetaminophen (Tylenol), Serum: <10 ug/mL — ABNORMAL LOW (ref 10–30)

## 2016-05-20 LAB — RAPID URINE DRUG SCREEN, HOSP PERFORMED
Amphetamines: NOT DETECTED
BENZODIAZEPINES: POSITIVE — AB
Barbiturates: NOT DETECTED
COCAINE: NOT DETECTED
OPIATES: NOT DETECTED
Tetrahydrocannabinol: NOT DETECTED

## 2016-05-20 LAB — ETHANOL

## 2016-05-20 MED ORDER — SERTRALINE HCL 50 MG PO TABS: 50.0000 mg | ORAL_TABLET | Freq: Every day | ORAL | Status: DC

## 2016-05-20 MED ORDER — SERTRALINE HCL 50 MG PO TABS
50.0000 mg | ORAL_TABLET | Freq: Once | ORAL | Status: AC
  Administered 2016-05-20: 50 mg via ORAL
  Filled 2016-05-20: qty 1

## 2016-05-20 NOTE — ED Provider Notes (Signed)
CSN: [id number]     Arrival date & time 05/19/16  2224 History  By signing my name below, I, Phillis HaggisGabriella Gaje, attest that this documentation has been prepared under the direction and in the presence of Tomasita CrumbleAdeleke Ambrosia Wisnewski, MD. Electronically Signed: Phillis HaggisGabriella Gaje, ED Scribe. 05/20/2016. 1:53 AM.    Chief Complaint  Patient presents with  . Paranoid  . Medical Clearance  . Hallucinations   The history is provided by the patient. No language interpreter was used.  HPI Comments: [name] Geer is a 55 y.o. Male with a hx of depression who presents to the Emergency Department complaining of paranoia onset a few days ago. Pt states that he is afraid to go home, that there is someone in his house that can hurt him. He reports that he believes his wife is having a relationship with someone. Pt was brought in by his wife; she states that this is the third time they have been to the ED this week for the same. She states that he has been having auditory and visual hallucinations of children in the house. Pt was recently discharged from [facility]. Wife states that pt has been incredibly fatigued for the past 6 months with physical illness. He was diagnosed with abnormally low testosterone levels. He was placed on thyroid medications which he had an adverse reaction to and stopped. He has been placed on Clomid. She states that over the course of his testosterone levels dropping, pt has been increasingly more depressed. He states that he has tried medications in the past for depression, with Wellbutrin working the best for him. He decided to stop taking it after having diarrhea and decreased appetite. Pt does not have a psychiatrist. Wife reports that they have been having difficulty with trying to get an appointment with a psychiatrist. Pt does not believe that his current symptoms relate to his worsening depression. He denies SI and HI.  Past Medical History  Diagnosis Date  . Hypercholesteremia   . Vitamin D deficiency   .  Depression 04/03/2016   Past Surgical History  Procedure Laterality Date  . Tonsillectomy     Family History  Problem Relation Age of Onset  . Heart attack Father   . Hypothyroidism Mother   . Other Neg Hx     hypogonadism   Social History  Substance Use Topics  . Smoking status: Never Smoker   . Smokeless tobacco: None  . Alcohol Use: No    Review of Systems 10 Systems reviewed and all are negative for acute change except as noted in the HPI.  Allergies  Review of patient's allergies indicates no known allergies.  Home Medications   Prior to Admission medications   Medication Sig Start Date End Date Taking? Authorizing Provider  ALPRAZolam Prudy Feeler(XANAX) 0.5 MG tablet Take 1 tablet by mouth at bedtime as needed for anxiety or sleep. Patient taking differently: Take 0.5 mg by mouth at bedtime as needed for anxiety or sleep.  05/18/16  Yes Lelon PerlaYvonne R Lowne Chase, DO  clomiPHENE (CLOMID) 50 MG tablet 1/4 tab daily Patient taking differently: Take 12.5 mg by mouth daily.  02/24/16  Yes Romero BellingSean Ellison, MD  ramelteon (ROZEREM) 8 MG tablet Take 1 tablet (8 mg total) by mouth at bedtime. 05/17/16  Yes Barrett HenleNicole Elizabeth Nadeau, PA-C  sildenafil (REVATIO) 20 MG tablet 1-5 pills, as needed for ED symptoms Patient not taking: Reported on 05/17/2016 05/02/16   Romero BellingSean Ellison, MD   BP 103/68 mmHg  Pulse 80  Temp(Src) 98.2  F (36.8 C) (Oral)  Resp 18  Ht 5\' 11"  (1.803 m)  Wt 164 lb (74.39 kg)  BMI 22.88 kg/m2  SpO2 95% Physical Exam  Constitutional: He is oriented to person, place, and time. Vital signs are normal. He appears well-developed and well-nourished.  Non-toxic appearance. He does not appear ill. No distress.  HENT:  Head: Normocephalic and atraumatic.  Nose: Nose normal.  Mouth/Throat: Oropharynx is clear and moist. No oropharyngeal exudate.  Eyes: Conjunctivae and EOM are normal. Pupils are equal, round, and reactive to light. No scleral icterus.  Neck: Normal range of motion. Neck  supple. No tracheal deviation, no edema, no erythema and normal range of motion present. No thyroid mass and no thyromegaly present.  Cardiovascular: Normal rate, regular rhythm, S1 normal, S2 normal, normal heart sounds, intact distal pulses and normal pulses.  Exam reveals no gallop and no friction rub.   No murmur heard. Pulmonary/Chest: Effort normal and breath sounds normal. No respiratory distress. He has no wheezes. He has no rhonchi. He has no rales.  Abdominal: Soft. Normal appearance and bowel sounds are normal. He exhibits no distension, no ascites and no mass. There is no hepatosplenomegaly. There is no tenderness. There is no rebound, no guarding and no CVA tenderness.  Musculoskeletal: Normal range of motion. He exhibits no edema or tenderness.  Lymphadenopathy:    He has no cervical adenopathy.  Neurological: He is alert and oriented to person, place, and time. He has normal strength. No cranial nerve deficit or sensory deficit.  Skin: Skin is warm, dry and intact. No petechiae and no rash noted. He is not diaphoretic. No erythema. No pallor.  Psychiatric: He exhibits a depressed mood.  Nursing note and vitals reviewed.   ED Course  Procedures (including critical care time) DIAGNOSTIC STUDIES: Oxygen Saturation is 95% on RA, normal by my interpretation.    COORDINATION OF CARE: 1:52 AM-Discussed treatment plan which includes labs and TTS consult with pt at bedside and pt agreed to plan.    Labs Review Labs Reviewed  COMPREHENSIVE METABOLIC PANEL - Abnormal; Notable for the following:    Glucose, Bld 118 (*)    BUN 22 (*)    All other components within normal limits  ACETAMINOPHEN LEVEL - Abnormal; Notable for the following:    Acetaminophen (Tylenol), Serum <10 (*)    All other components within normal limits  CBC - Abnormal; Notable for the following:    MCV 73.5 (*)    MCH 25.2 (*)    All other components within normal limits  URINE RAPID DRUG SCREEN, HOSP  PERFORMED - Abnormal; Notable for the following:    Benzodiazepines POSITIVE (*)    All other components within normal limits  ETHANOL  SALICYLATE LEVEL    Imaging Review No results found. I have personally reviewed and evaluated these images and lab results as part of my medical decision-making.   EKG Interpretation None      MDM   Final diagnoses:  None   Patient presents to the ED for depression and paranoid thoughts.  He has been seen here recently for the same and DC by behavioral health.  He continues to deny SI and HI, and thus does not meet criteria for TTS consultation. Wife is frustrated that they can not get an outpt appt.  I called [place] with behavioral health for suggestions on how to help the patient and he advised to call behavior health for an emergent appt.  This was relayed to  the family and phone number was given.  Patient will be placed on trial of zoloft for depression with first dose given in the ED.  He appears well and in NAD. Continues to deny SI and HI upon discharge.  VS remain within his normal limits and he is safe for DC.    I personally performed the services described in this documentation, which was scribed in my presence. The recorded information has been reviewed and is accurate.     Tomasita Crumble, MD 05/20/16 [phone]

## 2016-05-20 NOTE — Discharge Instructions (Signed)
Major Depressive Disorder [name], please call behavioral health first thing Monday morning for an emergent appointment after being seen in the ER.  Take zoloft nightly to help with your symptoms.  If anything worsens, come back to the ED immediately. Thank you. Major depressive disorder is a mental illness. It also may be called clinical depression or unipolar depression. Major depressive disorder usually causes feelings of sadness, hopelessness, or helplessness. Some people with this disorder do not feel particularly sad but lose interest in doing things they used to enjoy (anhedonia). Major depressive disorder also can cause physical symptoms. It can interfere with work, school, relationships, and other normal everyday activities. The disorder varies in severity but is longer lasting and more serious than the sadness we all feel from time to time in our lives. Major depressive disorder often is triggered by stressful life events or major life changes. Examples of these triggers include divorce, loss of your job or home, a move, and the death of a family member or close friend. Sometimes this disorder occurs for no obvious reason at all. People who have family members with major depressive disorder or bipolar disorder are at higher risk for developing this disorder, with or without life stressors. Major depressive disorder can occur at any age. It may occur just once in your life (single episode major depressive disorder). It may occur multiple times (recurrent major depressive disorder). SYMPTOMS People with major depressive disorder have either anhedonia or depressed mood on nearly a daily basis for at least 2 weeks or longer. Symptoms of depressed mood include:  Feelings of sadness (blue or down in the dumps) or emptiness.  Feelings of hopelessness or helplessness.  Tearfulness or episodes of crying (may be observed by others).  Irritability (children and adolescents). In addition to depressed  mood or anhedonia or both, people with this disorder have at least four of the following symptoms:  Difficulty sleeping or sleeping too much.   Significant change (increase or decrease) in appetite or weight.   Lack of energy or motivation.  Feelings of guilt and worthlessness.   Difficulty concentrating, remembering, or making decisions.  Unusually slow movement (psychomotor retardation) or restlessness (as observed by others).   Recurrent wishes for death, recurrent thoughts of self-harm (suicide), or a suicide attempt. People with major depressive disorder commonly have persistent negative thoughts about themselves, other people, and the world. People with severe major depressive disorder may experiencedistorted beliefs or perceptions about the world (psychotic delusions). They also may see or hear things that are not real (psychotic hallucinations). DIAGNOSIS Major depressive disorder is diagnosed through an assessment by your health care provider. Your health care provider will ask aboutaspects of your daily life, such as mood,sleep, and appetite, to see if you have the diagnostic symptoms of major depressive disorder. Your health care provider may ask about your medical history and use of alcohol or drugs, including prescription medicines. Your health care provider also may do a physical exam and blood work. This is because certain medical conditions and the use of certain substances can cause major depressive disorder-like symptoms (secondary depression). Your health care provider also may refer you to a mental health specialist for further evaluation and treatment. TREATMENT It is important to recognize the symptoms of major depressive disorder and seek treatment. The following treatments can be prescribed for this disorder:   Medicine. Antidepressant medicines usually are prescribed. Antidepressant medicines are thought to correct chemical imbalances in the brain that are  commonly associated with major  depressive disorder. Other types of medicine may be added if the symptoms do not respond to antidepressant medicines alone or if psychotic delusions or hallucinations occur.  Talk therapy. Talk therapy can be helpful in treating major depressive disorder by providing support, education, and guidance. Certain types of talk therapy also can help with negative thinking (cognitive behavioral therapy) and with relationship issues that trigger this disorder (interpersonal therapy). A mental health specialist can help determine which treatment is best for you. Most people with major depressive disorder do well with a combination of medicine and talk therapy. Treatments involving electrical stimulation of the brain can be used in situations with extremely severe symptoms or when medicine and talk therapy do not work over time. These treatments include electroconvulsive therapy, transcranial magnetic stimulation, and vagal nerve stimulation.   This information is not intended to replace advice given to you by your health care provider. Make sure you discuss any questions you have with your health care provider.   Document Released: 03/23/2013 Document Revised: 12/17/2014 Document Reviewed: 03/23/2013 Elsevier Interactive Patient Education [employer]

## 2016-05-20 NOTE — ED Notes (Signed)
Discharge instructions were reviewed in detail with pt and his wife. Pt was [facility] Monday as well as with his regular therapist.

## 2016-05-21 ENCOUNTER — Encounter (HOSPITAL_COMMUNITY): Payer: Self-pay

## 2016-05-21 ENCOUNTER — Inpatient Hospital Stay (HOSPITAL_COMMUNITY)
Admission: RE | Admit: 2016-05-21 | Discharge: 2016-05-30 | DRG: 885 | Disposition: A | Discharge status: Home / Self Care | Payer: BLUE CROSS/BLUE SHIELD | Attending: Psychiatry | Admitting: Psychiatry

## 2016-05-21 DIAGNOSIS — E7800 Pure hypercholesterolemia, unspecified: Secondary | ICD-10-CM | POA: Diagnosis present

## 2016-05-21 DIAGNOSIS — Z9114 Patient's other noncompliance with medication regimen: Secondary | ICD-10-CM | POA: Diagnosis not present

## 2016-05-21 DIAGNOSIS — Z8249 Family history of ischemic heart disease and other diseases of the circulatory system: Secondary | ICD-10-CM | POA: Diagnosis not present

## 2016-05-21 DIAGNOSIS — F333 Major depressive disorder, recurrent, severe with psychotic symptoms: Principal | ICD-10-CM | POA: Diagnosis present

## 2016-05-21 DIAGNOSIS — F29 Unspecified psychosis not due to a substance or known physiological condition: Secondary | ICD-10-CM

## 2016-05-21 DIAGNOSIS — F411 Generalized anxiety disorder: Secondary | ICD-10-CM | POA: Diagnosis present

## 2016-05-21 DIAGNOSIS — G4700 Insomnia, unspecified: Secondary | ICD-10-CM | POA: Diagnosis present

## 2016-05-21 DIAGNOSIS — E559 Vitamin D deficiency, unspecified: Secondary | ICD-10-CM | POA: Diagnosis present

## 2016-05-21 MED ORDER — RAMELTEON 8 MG PO TABS
8.0000 mg | ORAL_TABLET | Freq: Every day | ORAL | Status: DC
  Administered 2016-05-21 – 2016-05-29 (×9): 8 mg via ORAL
  Filled 2016-05-21 (×11): qty 1

## 2016-05-21 MED ORDER — LORAZEPAM 0.5 MG PO TABS
0.5000 mg | ORAL_TABLET | Freq: Four times a day (QID) | ORAL | Status: DC | PRN
  Administered 2016-05-21: 0.5 mg via ORAL
  Filled 2016-05-21: qty 1

## 2016-05-21 MED ORDER — OLANZAPINE 5 MG PO TBDP: 5.0000 mg | ORAL_TABLET | Freq: Three times a day (TID) | ORAL | Status: DC | PRN

## 2016-05-21 MED ORDER — ACETAMINOPHEN 325 MG PO TABS: 650.0000 mg | ORAL_TABLET | Freq: Four times a day (QID) | ORAL | Status: DC | PRN

## 2016-05-21 MED ORDER — MAGNESIUM HYDROXIDE 400 MG/5ML PO SUSP: 30.0000 mL | Freq: Every day | ORAL | Status: DC | PRN

## 2016-05-21 MED ORDER — ALUM & MAG HYDROXIDE-SIMETH 200-200-20 MG/5ML PO SUSP: 30.0000 mL | ORAL | Status: DC | PRN

## 2016-05-21 NOTE — BH Assessment (Signed)
Tele Assessment Note   Bruce [name] is an 55 y.o. male.  Pt presents voluntarily for assessment at [facility] accompanied by his wife Bruce [name] [phone]. Writer spoke w/ wife at length earlier this am. Wife reports they saw EDP Bruce [name] this am in ED and Bruce [name] recommended an emergent app[facility]. Pt's psychologist is Bruce [name]. Wife says has been trying for two months to get an appt for pt with a psychiatrist. Pt is oriented x 4 and is soft spoken. Pt maintains fair eye contact. His affect is flat. Pt is poor historian as he prefers wife to Chief Executive Officertell writer what is going on. Pt does answer some of writer's questions. It takes pt several seconds to respond to some of writer's questions. Pt reports being "fearful" because there is "someone" is the pt's house who is having an affair with his wife. Pt reports he can hear someone moving throughout the house. He reports he doesn't feel safe in any room. Pt denies SI and HI. He endorses insomnia, irritability and loss of interest in usual pleasures.   Wife Provides collateral info. She is very tearful. Wife reports pt had a "depressive episode" this Jan but pt didn't want to take meds. She says he tried Wellbutrin for 10 days but he became convinced he had a bowel obstruction, even though MD assured him there was no obstruction. Pt even demanded two x rays which showed no obstruction.  Wife says has never had delusions or hallucinations until the past week. Wife reports pt isn't sleeping. She says pt tried to jump out of their moving car twice in their neighborhood b/c he was afraid to go in their house. She says pt refuses to be in house alone. 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 She says pt believes someone is hacking his email and computer at work. Wife says pt believes that other cars all know pt's wife and they are all plotting together so pt can continue her affair. Wife denies affair and sts they've happily  been married for 28 years and have raised 4 kids. Wife says pt missed son's high school graduation this weekend, and he has never previously missed a milestone in their kids' lives. Wife says pt often tells wife that she is trying to get rid of him. She reports lately she has become frightened because of the "look in his eyes." Wife reports pt has never abused alcohol or drugs. She says pt takes one Xanax nightly for sleep. Wife sts in the past week pt has refuses to take Xanax b/c he is convinced "someone is tampering with his medications." Wife reports there is no family hx of MI, SA or suicide. Pt has no hx of inpatient treatment. Wife reports pt began taking Clomid a few mos ago for low testosterone. She says pt lost 30 lbs but he is beginning to gain weight. She says pt doesn't like to take meds. Wife wonders whether Clomid could be causing his current symptoms.     Diagnosis: Major Depressive Disorder, Single Episode, Moderate Unspecified Psychotic Disorder  Past Medical History:  Past Medical History  Diagnosis Date  . Hypercholesteremia   . Vitamin D deficiency   . Depression 04/03/2016    Past Surgical History  Procedure Laterality Date  . Tonsillectomy      Family History:  Family History  Problem Relation Age of Onset  . Heart attack Father   . Hypothyroidism Mother   .  Other Neg Hx     hypogonadism    Social History:  reports that he has never smoked. He does not have any smokeless tobacco history on file. He reports that he does not drink alcohol or use illicit drugs.  Additional Social History:  Alcohol / Drug Use Pain Medications: pt denies abuse - see pta meds list Prescriptions: pt denies abuse - see pta med  Over the Counter: SEE MAR  CIWA:   COWS:    PATIENT STRENGTHS: (choose at least two) Average or above average intelligence Communication skills Financial means General fund of knowledge Physical Health Supportive family/friends Work  skills  Allergies: No Known Allergies  Home Medications:  Medications Prior to Admission  Medication Sig Dispense Refill  . ALPRAZolam (XANAX) 0.5 MG tablet Take 1 tablet by mouth at bedtime as needed for anxiety or sleep. (Patient taking differently: Take 0.5 mg by mouth at bedtime as needed for anxiety or sleep. ) 30 tablet 0  . clomiPHENE (CLOMID) 50 MG tablet 1/4 tab daily (Patient taking differently: Take 12.5 mg by mouth daily. ) 15 tablet 3  . ramelteon (ROZEREM) 8 MG tablet Take 1 tablet (8 mg total) by mouth at bedtime. 14 tablet 0  . sertraline (ZOLOFT) 50 MG tablet Take 1 tablet (50 mg total) by mouth daily. 10 tablet 0  . sildenafil (REVATIO) 20 MG tablet 1-5 pills, as needed for ED symptoms (Patient not taking: Reported on 05/17/2016) 60 tablet 11    OB/GYN Status:  No LMP for male patient.  General Assessment Data Location of Assessment: [facility] Assessment Services TTS Assessment: In system Is this a Tele or Face-to-Face Assessment?: Face-to-Face Is this an Initial Assessment or a Re-assessment for this encounter?: Initial Assessment Marital status: Married Is patient pregnant?: No Pregnancy Status: No Living Arrangements: Children, Spouse/significant other Can pt return to current living arrangement?: Yes Admission Status: Voluntary Is patient capable of signing voluntary admission?: Yes Referral Source: Self/Family/Friend Insurance type: blue cross     Crisis Care Plan Living Arrangements: Children, Spouse/significant other Name of Psychiatrist: none Name of Therapist: Barbie [name]  Education Status Is patient currently in school?: No Highest grade of school patient has completed: 17 Name of school: [employer]  Risk to self with the past 6 months Suicidal Ideation: No Has patient been a risk to self within the past 6 months prior to admission? : No Suicidal Intent: No Has patient had any suicidal intent within the past 6 months prior to admission? :  No Is patient at risk for suicide?: No Suicidal Plan?: No Has patient had any suicidal plan within the past 6 months prior to admission? : No Access to Means: No What has been your use of drugs/alcohol within the last 12 months?: none Previous Attempts/Gestures: No How many times?: 0 Other Self Harm Risks: none Triggers for Past Attempts:  (n/a) Intentional Self Injurious Behavior: None Family Suicide History: No Recent stressful life event(s): Other (Comment) (fearful someone in their house and person is having affairwi) Persecutory voices/beliefs?: No Depression: Yes Depression Symptoms: Loss of interest in usual pleasures, Isolating, Insomnia Substance abuse history and/or treatment for substance abuse?: No Suicide prevention information given to non-admitted patients: Not applicable  Risk to Others within the past 6 months Homicidal Ideation: No Does patient have any lifetime risk of violence toward others beyond the six months prior to admission? : No Thoughts of Harm to Others: No Current Homicidal Intent: No Current Homicidal Plan: No Access to Homicidal Means:  No Identified Victim: none History of harm to others?: No Assessment of Violence: None Noted Violent Behavior Description: pt has no hx violence Does patient have access to weapons?: No Criminal Charges Pending?: No Does patient have a court date: No Is patient on probation?: No  Psychosis Hallucinations: Auditory (hears "someone" in their house) Delusions: Jealous (pt believes someone living in their house & having affairwif)  Mental Status Report Appearance/Hygiene: Unremarkable (appropriate street clothing for weather) Eye Contact: Fair Motor Activity: Freedom of movement Speech: Logical/coherent, Soft Level of Consciousness: Alert, Quiet/awake Mood: Fearful Affect: Blunted Anxiety Level: Moderate Thought Processes: Relevant, Coherent Judgement: Impaired Orientation: Person, Place, Time,  Situation Obsessive Compulsive Thoughts/Behaviors: None  Cognitive Functioning Concentration: Normal Memory: Recent Intact, Remote Intact IQ: Average Insight: Fair Impulse Control: Fair Appetite: Fair Weight Loss: 30 (pt had lost weight d/t meds but now gaining weight) Sleep: Decreased Total Hours of Sleep: 3 Vegetative Symptoms: None  ADLScreening [facility] Assessment Services) Patient's cognitive ability adequate to safely complete daily activities?: Yes Patient able to express need for assistance with ADLs?: Yes Independently performs ADLs?: Yes (appropriate for developmental age)  Prior Inpatient Therapy Prior Inpatient Therapy: No Prior Therapy Dates: na Prior Therapy Facilty/Provider(s): na Reason for Treatment: na  Prior Outpatient Therapy Prior Outpatient Therapy: Yes Prior Therapy Dates: currently Prior Therapy Facilty/Provider(s): Bruce [name] psychologist Reason for Treatment: depression Does patient have an ACCT team?: No Does patient have Intensive In-House Services?  : No Does patient have [facility] services? : No Does patient have P4CC services?: No  ADL Screening (condition at time of admission) Patient's cognitive ability adequate to safely complete daily activities?: Yes Is the patient deaf or have difficulty hearing?: No Does the patient have difficulty seeing, even when wearing glasses/contacts?: No Does the patient have difficulty concentrating, remembering, or making decisions?: No Patient able to express need for assistance with ADLs?: Yes Does the patient have difficulty dressing or bathing?: No Independently performs ADLs?: Yes (appropriate for developmental age) Does the patient have difficulty walking or climbing stairs?: No Weakness of Legs: None Weakness of Arms/Hands: None  Home Assistive Devices/Equipment Home Assistive Devices/Equipment: Eyeglasses    Abuse/Neglect Assessment (Assessment to be complete while patient is alone) Physical  Abuse: Denies Verbal Abuse: Denies Sexual Abuse: Denies Exploitation of patient/patient's resources: Denies Self-Neglect: Denies     Merchant navy officer (For Healthcare) Does patient have an advance directive?: No Would patient like information on creating an advanced directive?: No - patient declined information    Additional Information 1:1 In Past 12 Months?: No CIRT Risk: No Elopement Risk: No Does patient have medical clearance?: No     Disposition:  Disposition Initial Assessment Completed for this Encounter: Yes Disposition of Patient: Inpatient treatment program Type of inpatient treatment program: Adult (laura davis NP accepts to 503-2 to dr Elna Breslow)  Shirlee Latch, Marlys Stegmaier P 05/21/2016 3:47 PM

## 2016-05-21 NOTE — Progress Notes (Signed)
Patient just arrive.

## 2016-05-21 NOTE — Progress Notes (Signed)
D:Patient in his room sitting on the side of the bed staring off into space.  Patient appears sad and depressed.  Patient states his goal is to feel better.  Patient does appear paranoid.  Per patient's wife this is all new for him.  She states patient was on the medication Clomid for low testosterone and that is when she noticed a change in his.  Patient an his wife are concerned about patients behaviors.  Patient denies SI/HI and denies  A: Staff to monitor Q 15 min's for safety.  Encouragement and support offered.  Scheduled medications administered per orders.  Ativan administered prn for anxiety. R: Patient remains safe on the unit.  Patient did not attend group tonight.  Patient not visible on the unit tonight.  Patient taking administered medications.

## 2016-05-21 NOTE — BH Assessment (Signed)
Bruce [name] is a 55 year old male being admitted to [facility]2 as a walk in.  He was brought in by his wife reporting that he is paranoid, fearful, hearing things move around his house and is afraid that people at work are trying to hurt him.  He denies SI/HI or A/V at this time.  He was very soft spoken and thought blocking noted at times.  He denies any pain or discomfort and appears to be in no physical distress.  He reported that he is having trouble sleeping and is getting 3-4 hours per night of broken sleep.  He stopped taking his clomid for his testosterone level a few days ago and only takes xanax for his sleep problems.  His wife reported that he hasn't been taking medications because he thinks people are tampering with those medications.  She is wondering if the clomid is causing his symptoms.  He is alert and oriented X 4.  Appetite is poor but has started eating again.  Admission paperwork completed and signed.  Belongings searched and secured in locker # 16, see belongings sheet in paper chart.  Skin assessment completed and no skin issues noted.  Q 15 minute checks initiated for safety.  We will monitor the progress towards his goals.

## 2016-05-21 NOTE — Tx Team (Signed)
Initial Interdisciplinary Treatment Plan   PATIENT STRESSORS: Health problems Medication change or noncompliance Occupational concerns   PATIENT STRENGTHS: Average or above average intelligence Supportive family/friends Work skills   PROBLEM LIST: Problem List/Patient Goals Date to be addressed Date deferred Reason deferred Estimated date of resolution  Psychosis 05/21/16     Depression 05/21/16     Anxiety 05/21/16     "I want to feel better" 05/21/16     "Reduce my anxiety" 05/21/16                              DISCHARGE CRITERIA:  Improved stabilization in mood, thinking, and/or behavior Verbal commitment to aftercare and medication compliance  PRELIMINARY DISCHARGE PLAN: Outpatient therapy Medication management  PATIENT/FAMIILY INVOLVEMENT: This treatment plan has been presented to and reviewed with the patient, Bruce [name].  The patient and family have been given the opportunity to ask questions and make suggestions.  Levin BaconHeather V Velinda Wrobel 05/21/2016, 6:53 PM

## 2016-05-22 ENCOUNTER — Telehealth: Payer: Self-pay | Admitting: Endocrinology

## 2016-05-22 ENCOUNTER — Encounter (HOSPITAL_COMMUNITY): Payer: Self-pay | Admitting: Psychiatry

## 2016-05-22 ENCOUNTER — Inpatient Hospital Stay (HOSPITAL_COMMUNITY): Payer: BLUE CROSS/BLUE SHIELD

## 2016-05-22 DIAGNOSIS — F333 Major depressive disorder, recurrent, severe with psychotic symptoms: Principal | ICD-10-CM

## 2016-05-22 MED ORDER — LORAZEPAM 2 MG/ML IJ SOLN: 1.0000 mg | Freq: Four times a day (QID) | INTRAMUSCULAR | Status: DC | PRN

## 2016-05-22 MED ORDER — LORAZEPAM 1 MG PO TABS: 1.0000 mg | ORAL_TABLET | Freq: Four times a day (QID) | ORAL | Status: DC | PRN

## 2016-05-22 MED ORDER — QUETIAPINE FUMARATE 25 MG PO TABS
25.0000 mg | ORAL_TABLET | Freq: Every day | ORAL | Status: DC
  Administered 2016-05-22: 50 mg via ORAL
  Filled 2016-05-22 (×4): qty 1

## 2016-05-22 MED ORDER — OLANZAPINE 5 MG PO TBDP
5.0000 mg | ORAL_TABLET | Freq: Three times a day (TID) | ORAL | Status: DC | PRN
Start: 2016-05-22 — End: 2016-05-25

## 2016-05-22 MED ORDER — SERTRALINE HCL 25 MG PO TABS
25.0000 mg | ORAL_TABLET | Freq: Every day | ORAL | Status: DC
  Administered 2016-05-22 – 2016-05-23 (×2): 25 mg via ORAL
  Filled 2016-05-22 (×5): qty 1

## 2016-05-22 NOTE — Progress Notes (Signed)
NUTRITION ASSESSMENT  Pt identified as at risk on the Malnutrition Screen Tool  INTERVENTION: 1. Educated patient on the importance of nutrition and encouraged intake of food and beverages. 2. Discussed weight goals. 3. Supplements: Ensure Enlive po BID, each supplement provides 350 kcal and 20 grams of protein  NUTRITION DIAGNOSIS: Unintentional weight loss related to sub-optimal intake as evidenced by pt report.   Goal: Pt to meet >/= 90% of their estimated nutrition needs.  Monitor:  PO intake  Assessment:  Bruce [name] is a  55 y.o. male with a hx of depression who presents with paranoia. Pt is afraid to go home, believes someone there will hurt him. He also reports belief that his wife is having a relationship with someone. Was diagnosed with low testosterone levels on Clomid. Appears pt's depression has gotten worse with declining levels. Decreased PO intake likely related to depression, paranoia. Pt does exhibit a 25#/14% severe wt loss in 4 months. Possible evidence of malnutrition. Pt is also 88% of IBW.   Height: Ht Readings from Last 1 Encounters:  05/21/16 5\' 11"  (1.803 m)    Weight: Wt Readings from Last 1 Encounters:  05/21/16 153 lb (69.4 kg)    Weight Hx: Wt Readings from Last 10 Encounters:  05/21/16 153 lb (69.4 kg)  05/19/16 164 lb (74.39 kg)  05/02/16 164 lb 6.4 oz (74.571 kg)  04/03/16 160 lb 9.6 oz (72.848 kg)  03/22/16 163 lb 9.6 oz (74.208 kg)  03/12/16 169 lb 3.2 oz (76.749 kg)  02/23/16 172 lb (78.019 kg)  02/17/16 171 lb (77.565 kg)  02/08/16 173 lb (78.472 kg)  01/27/16 178 lb 9.6 oz (81.012 kg)    BMI:  Body mass index is 21.35 kg/(m^2). Pt meets criteria for normal based on current BMI.  Estimated Nutritional Needs: Kcal: 25-30 kcal/kg Protein: > 1 gram protein/kg Fluid: 1 ml/kcal  Diet Order: Diet regular Room service appropriate?: Yes; Fluid consistency:: Thin Pt is also offered choice of unit snacks mid-morning and mid-afternoon.   Pt is eating as desired.   Lab results and medications reviewed.   Dionne AnoWilliam M. Trysten Berti, MS, RD LDN Inpatient Clinical Dietitian Pager [phone]

## 2016-05-22 NOTE — Telephone Encounter (Signed)
i called wife.  It is very unlikely that the clomid makes a difference either way.

## 2016-05-22 NOTE — Progress Notes (Signed)
D- Patient has been sitting quietly in his room.  Patient did not engage Clinical research associatewriter in 1:1 staff talks.  Patient denies SI, HI and AVH.  Patient rated his anxiety and depression as low.   A- Assess patient for safety, offer medications as prescribed  R-  Continue to monitor for as prescribed.

## 2016-05-22 NOTE — [facility] Group Notes (Signed)
[facility] LCSW Group Therapy  05/22/2016 , 1:24 PM   Type of Therapy:  Group Therapy  Participation Level:  Active  Participation Quality:  Attentive  Affect:  Appropriate  Cognitive:  Alert  Insight:  Improving  Engagement in Therapy:  Engaged  Modes of Intervention:  Discussion, Exploration and Socialization  Summary of Progress/Problems: Today's group focused on the term Diagnosis.  Participants were asked to define the term, and then pronounce whether it is a negative, positive or neutral term. Stayed the entire time, engaged throughout.  Talked about his family coming here as  immigrants, that they were destitute, but through hard work and perserverence were able to not only survive, but thrive.  He continues to apply that today as he has learned to adapt to new and changing situations.    Bruce [name], Bruce [name] 05/22/2016 , 1:24 PM

## 2016-05-22 NOTE — Telephone Encounter (Signed)
Pt is hospitalized, the wife wants to speak directly with Dr. Everardo AllEllison if you can because he is suffering with severe depression, and they are questioning the meds he is on but the wife wants to be sure with Dr Everardo AllEllison what he thinks about things. Clomid is the rx in question.  [facility]

## 2016-05-22 NOTE — Progress Notes (Signed)
Patient ID: [name] Wissmann, male   DOB: 13-Jan-1961, 55 y.o.   MRN: [id number] D: Client family visited this shift. Client isolated to his room this shift, reports depression "2" of 10. "feel pretty good today" Goal: "to have a good day" A: Writer encouraged interaction, which was minimum. Writer reviewed medications, administered as ordered. Staff will monitor q6215min for safety. R: Client is safe one the unit, did not attend group.

## 2016-05-22 NOTE — Progress Notes (Signed)
Adult Psychoeducational Group Note  Date:  05/22/2016 Time:  8:15 PM  Group Topic/Focus:  Wrap-Up Group:   The focus of this group is to help patients review their daily goal of treatment and discuss progress on daily workbooks.  Participation Level:  Did Not Attend  Pt did not attend wrap-up group.    Bruce NeerJasmine S [name] Bruce [name] 05/22/2016, 8:40 PM

## 2016-05-22 NOTE — BHH Counselor (Signed)
Adult Comprehensive Assessment  Patient ID: Bruce [name], male   DOB: 14-Aug-1961, 55 y.o.   MRN: [id number]  Information Source: Information source: Patient  Current Stressors:  Employment / Job issues: Difficulty focusing due to severe depression  Living/Environment/Situation:  Living Arrangements: Spouse/significant other, Children Living conditions (as described by patient or guardian): good How long has patient lived in current situation?: Since [id number]  Family History:  Marital status: Married Number of Years Married: 28 What types of issues is patient dealing with in the relationship?: Although he did not say this to me, he told the Dr he believes his wife is having an affair Are you sexually active?: Yes What is your sexual orientation?: straight Does patient have children?: Yes How many children?: 4 How is patient's relationship with their children?: good  Childhood History:  By whom was/is the patient raised?: Both parents Additional childhood history information: fine Patient's description of current relationship with people who raised him/her: Father died at 3745 How were you disciplined when you got in trouble as a child/adolescent?: Spankings Does patient have siblings?: Yes Number of Siblings: 2 Description of patient's current relationship with siblings: closer to older one Did patient suffer any verbal/emotional/physical/sexual abuse as a child?: No Did patient suffer from severe childhood neglect?: No Has patient ever been sexually abused/assaulted/raped as an adolescent or adult?: No Was the patient ever a victim of a crime or a disaster?: No Witnessed domestic violence?: No Has patient been effected by domestic violence as an adult?: No  Education:  Highest grade of school patient has completed: 2916 Name of school: [employer]  Employment/Work Situation:   Employment situation: Employed Where is patient currently employed?: Scientist, physiologicalself emplyed-engineering  consultant How long has patient been employed?: 3 years Patient's job has been impacted by current illness: Yes Describe how patient's job has been impacted: depression has made it difficult What is the longest time patient has a held a job?: 25 years 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 Has patient ever been in the Eli Lilly and Companymilitary?: No Are There Guns or Other Weapons in Your Home?: No  Financial Resources:   Financial resources: Income from employment Does patient have a representative payee or guardian?: No  Alcohol/Substance Abuse:   What has been your use of drugs/alcohol within the last 12 months?: none Alcohol/Substance Abuse Treatment Hx: Denies past history Has alcohol/substance abuse ever caused legal problems?: No  Social Support System:   Conservation officer, natureatient's Community Support System: Good Describe Community Support System: family and friends Type of faith/religion: Occupational psychologisthave studied both Islam and Christianity How does patient's faith help to cope with current illness?: "I believe in God, but my belief does not give me as much strenght as it should  Leisure/Recreation:   Leisure and Hobbies: fishing, wter skiing, gardening,   Strengths/Needs:   What things does the patient do well?: good person, good provider, good husband, good father In what areas does patient struggle / problems for patient: balance work and life  Discharge Plan:   Does patient have access to transportation?: Yes Will patient be returning to same living situation after discharge?: Yes Currently receiving community mental health services: No If no, would patient like referral for services when discharged?: Yes (What county?) Medical sales representative([place]) Does patient have financial barriers related to discharge medications?: No  Summary/Recommendations:   Summary and Recommendations (to be completed by the evaluator): Bruce [name] is a 55 YO Caucasian male diagnosed with MDD, recurrent, severe with  psychosis.  Prior to  admission, he became severely depressed, and susequently paranoid, believing his wife is having an affair.  He has no previous mental health history, has no family history of depression and is unable to identify any triggers for this episode.  He can benefit from crises stabilization, medicaiton managment, therapeutic milieu and referral for services.  Daryel Gerald B. 05/22/2016

## 2016-05-22 NOTE — H&P (Addendum)
Psychiatric Admission Assessment Adult  Patient Identification: Bruce [name] MRN:  [id number] Date of Evaluation:  05/22/2016 Chief Complaint:Pt states " I was feeling depressed and thought someone was in the house trying to hurt me.'    Principal Diagnosis: MDD (major depressive disorder), recurrent, severe, with psychosis (HCC) Diagnosis:   Patient Active Problem List   Diagnosis Date Noted  . MDD (major depressive disorder), recurrent, severe, with psychosis (HCC) [F33.3] 05/21/2016  . Low testosterone [E29.1] 05/18/2016  . Depression [F32.9] 04/03/2016  . Constipation [K59.00] 03/22/2016  . Insomnia [G47.00] 03/12/2016  . 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 Hypogonadism, male [E29.1] 02/17/2016  . Fatigue [R53.83] 11/28/2015  . Erectile dysfunction [N52.9] 11/28/2015  . Decreased libido [R68.82] 11/28/2015  . Snoring [R06.83] 11/28/2015  . Generalized anxiety disorder [F41.1] 11/28/2015  . Hypercholesteremia [E78.00]   . Vitamin D deficiency [E55.9]       History of Present Illness: Bruce [name] is a 55 y.o. Male, who is married , employed as an Acupuncturist , who lives in [place] , with his wife and 2 kids , has a hx of depression , presented  voluntarily to [facility] accompanied by his wife Bruce [name] [phone], for worsening depression and psychosis.  Per initial notes in EHR "  Writer spoke w/ wife at length earlier this am. Wife reports they saw EDP Oni this am in ED and Oni recommended an emergent appt at [facility] outpatient clinic today. Pt's psychologist is Bruce [name]. Wife says has been trying for two months to get an appt for pt with a psychiatrist. Pt is oriented x 4 and is soft spoken. Pt maintains fair eye contact. His affect is flat. Pt is poor historian as he prefers wife to Chief Executive Officer what is going on. Pt does answer some of writer's questions. It takes pt several seconds to respond to some of writer's questions. Pt reports  being "fearful" because there is "someone" is the pt's house who is having an affair with his wife. Pt reports he can hear someone moving throughout the house. He reports he doesn't feel safe in any room. Pt denies SI and HI. He endorses insomnia, irritability and loss of interest in usual pleasures. Wife Provides collateral info. She is very tearful. Wife reports pt had a "depressive episode" this Jan but pt didn't want to take meds. She says he tried Wellbutrin for 10 days but he became convinced he had a bowel obstruction, even though MD assured him there was no obstruction. Pt even demanded two x rays which showed no obstruction. Wife says has never had delusions or hallucinations until the past week. Wife reports pt isn't sleeping. She says pt tried to jump out of their moving car twice in their neighborhood b/c he was afraid to go in their house. She says pt refuses to be in house alone. 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 She says pt believes someone is hacking his email and computer at work. Wife says pt believes that other cars all know pt's wife and they are all plotting together so pt can continue her affair. Wife denies affair and sts they've happily been married for 28 years and have raised 4 kids. Wife says pt missed son's high school graduation this weekend, and he has never previously missed a milestone in their kids' lives. Wife says pt often tells wife that she is trying to get rid of him. She reports lately she has become frightened because of  the "look in his eyes." Wife reports pt has never abused alcohol or drugs. She says pt takes one Xanax nightly for sleep. Wife sts in the past week pt has refuses to take Xanax b/c he is convinced "someone is tampering with his medications." Wife reports there is no family hx of MI, SA or suicide. Pt has no hx of inpatient treatment. Wife reports pt began taking Clomid a few mos ago for low testosterone. She says pt lost 30 lbs but  he is beginning to gain weight. She says pt doesn't like to take meds. Wife wonders whether Clomid could be causing his current symptoms. "    Patient seen and chart reviewed today .Discussed patient with treatment team. Pt today is seen as withdrawn , slow , and paranoid. Pt continues to have depressive sx like sadness, inability to concentrate , has anhedonia as well as sleep issues. Pt also reports being paranoid - that there is someone in the house and someone is having an affair with his wife. Pt reports he continues to be preoccupied by these delusions and is paranoid - hence he remains a danger to self or others.  Pt reports he was started on lexapro by his family doctor in January - but he had ADRs - hence was stopped after few days. He also was started on Wellbutrin - but had severe constipation and had to be stopped after 2 weeks. Pt later on was started on xanax at bedtime for sleep - has been on it since the past 4 months or so .  Pt denies any other issues , denies hx of suicide attempts or mental health hospitalizations.   Associated Signs/Symptoms: Depression Symptoms:  depressed mood, anhedonia, insomnia, psychomotor retardation, fatigue, feelings of worthlessness/guilt, difficulty concentrating, weight loss, decreased appetite, (Hypo) Manic Symptoms:  Delusions, Impulsivity, Anxiety Symptoms:  denies Psychotic Symptoms:  Delusions, Paranoia, PTSD Symptoms: Negative Total Time spent with patient: 45 minutes  Past Psychiatric History: Pt denies hx of mental illness, denies past admissions or suicide attempts. Pt reports he was started on antidepressants by PCP - see above for details.  Is the patient at risk to self? Yes.    Has the patient been a risk to self in the past 6 months? Yes.    Has the patient been a risk to self within the distant past? No.  Is the patient a risk to others? Yes.    Has the patient been a risk to others in the past 6 months? Yes.    Has  the patient been a risk to others within the distant past? No.   Prior Inpatient Therapy: Prior Inpatient Therapy: No Prior Therapy Dates: na Prior Therapy Facilty/Provider(s): na Reason for Treatment: na Prior Outpatient Therapy: Prior Outpatient Therapy: Yes Prior Therapy Dates: currently Prior Therapy Facilty/Provider(s): Bruce [name] psychologist Reason for Treatment: depression Does patient have an ACCT team?: No Does patient have Intensive In-House Services?  : No Does patient have [facility] services? : No Does patient have P4CC services?: No  Alcohol Screening: 1. How often do you have a drink containing alcohol?: Never 9. Have you or someone else been injured as a result of your drinking?: No 10. Has a relative or friend or a doctor or another health worker been concerned about your drinking or suggested you cut down?: No Alcohol Use Disorder Identification Test Final Score (AUDIT): 0 Brief Intervention: AUDIT score less than 7 or less-screening does not suggest unhealthy drinking-brief intervention not indicated Substance Abuse History  in the last 12 months:  No. Consequences of Substance Abuse: Negative Previous Psychotropic Medications: Yes lexapro ( racing heart rate ), wellbutrin ( constipation) , xanax  Psychological Evaluations: No  Past Medical History:  Past Medical History  Diagnosis Date  . Hypercholesteremia   . Vitamin D deficiency   . Depression 04/03/2016    Past Surgical History  Procedure Laterality Date  . Tonsillectomy     Family History:  Family History  Problem Relation Age of Onset  . Heart attack Father   . Hypothyroidism Mother   . Other Neg Hx     hypogonadism   Family Psychiatric  History:denies Tobacco Screening:denies Social History: Pt is married , employed as an Acupuncturistelectrical engineer, lives in [place] with his wife and children. History  Alcohol Use No     History  Drug Use No    Additional Social History: Marital status:  Married    Pain Medications: pt denies abuse - see pta meds list Prescriptions: pt denies abuse - see pta med  Over the Counter: SEE MAR History of alcohol / drug use?: No history of alcohol / drug abuse Longest period of sobriety (when/how long): NA                    Allergies:  No Known Allergies Lab Results: No results found for this or any previous visit (from the past 48 hour(s)).  Blood Alcohol level:  Lab Results  Component Value Date   [facility]   ETH <5 05/17/2016    Metabolic Disorder Labs:  Lab Results  Component Value Date   HGBA1C 5.5 01/27/2016   MPG 111 01/27/2016   Lab Results  Component Value Date   PROLACTIN 7.4 02/17/2016   Lab Results  Component Value Date   CHOL 232* 11/29/2015   TRIG 220.0* 11/29/2015   HDL 42.10 11/29/2015   CHOLHDL 6 11/29/2015   VLDL 44.0* 11/29/2015   LDLCALC 84 11/23/2011    Current Medications: Current Facility-Administered Medications  Medication Dose Route Frequency Provider Last Rate Last Dose  . acetaminophen (TYLENOL) tablet 650 mg  650 mg Oral Q6H PRN Bruce LeylandLaura A Davis, NP      . alum & mag hydroxide-simeth (MAALOX/MYLANTA) 200-200-20 MG/5ML suspension 30 mL  30 mL Oral Q4H PRN Bruce LeylandLaura A Davis, NP      . LORazepam (ATIVAN) tablet 1 mg  1 mg Oral Q6H PRN Bruce LongsSaramma Alys Dulak, MD       Or  . LORazepam (ATIVAN) injection 1 mg  1 mg Intramuscular Q6H PRN Bruce LongsSaramma Antonela Freiman, MD      . magnesium hydroxide (MILK OF MAGNESIA) suspension 30 mL  30 mL Oral Daily PRN Bruce LeylandLaura A Davis, NP      . OLANZapine zydis (ZYPREXA) disintegrating tablet 5 mg  5 mg Oral Q8H PRN Bruce Celli, MD      . QUEtiapine (SEROQUEL) tablet 25 mg  25 mg Oral QHS Bruce Valadez, MD      . ramelteon (ROZEREM) tablet 8 mg  8 mg Oral QHS Bruce LeylandLaura A Davis, NP   8 mg at 05/21/16 2147  . sertraline (ZOLOFT) tablet 25 mg  25 mg Oral Daily Bruce LongsSaramma Anjani Feuerborn, MD       PTA Medications: Prescriptions prior to admission  Medication Sig Dispense Refill Last Dose   . ALPRAZolam (XANAX) 0.5 MG tablet Take 1 tablet by mouth at bedtime as needed for anxiety or sleep. (Patient taking differently: Take 0.5 mg by mouth at bedtime as  needed for anxiety or sleep. ) 30 tablet 0 05/19/2016 at 0830  . clomiPHENE (CLOMID) 50 MG tablet 1/4 tab daily (Patient taking differently: Take 12.5 mg by mouth daily. ) 15 tablet 3 Past Week at Unknown time  . ramelteon (ROZEREM) 8 MG tablet Take 1 tablet (8 mg total) by mouth at bedtime. 14 tablet 0 05/18/2016 at Unknown time  . sertraline (ZOLOFT) 50 MG tablet Take 1 tablet (50 mg total) by mouth daily. 10 tablet 0   . sildenafil (REVATIO) 20 MG tablet 1-5 pills, as needed for ED symptoms (Patient not taking: Reported on 05/17/2016) 60 tablet 11 unknown    Musculoskeletal: Strength & Muscle Tone: within normal limits Gait & Station: normal Patient leans: N/A  Psychiatric Specialty Exam: Physical Exam  Nursing note and vitals reviewed.   Review of Systems  Psychiatric/Behavioral: Positive for depression. The patient is nervous/anxious and has insomnia.   All other systems reviewed and are negative.   Blood pressure 107/66, pulse 95, temperature 98.3 F (36.8 C), temperature source Oral, resp. rate 18, height  (1.803 m), weight 69.4 kg (153 lb), SpO2 100 %.Body mass index is 21.35 kg/(m^2).  General Appearance: Guarded  Eye Contact:  Fair  Speech:  Slow  Volume:  Decreased  Mood:  Depressed and Dysphoric  Affect:  Depressed  Thought Process:  Goal Directed and Descriptions of Associations: Intact  Orientation:  Full (Time, Place, and Person)  Thought Content:  Delusions, Paranoid Ideation and Rumination  Suicidal Thoughts:  No may be a potential danger to self or others due to severe paranoid delusions  Homicidal Thoughts:  No  Memory:  Immediate;   Fair Recent;   Fair Remote;   Fair  Judgement:  Impaired  Insight:  Shallow  Psychomotor Activity:  Decreased  Concentration:  Concentration: Fair and Attention  Span: Fair  Recall:  [employer] of Knowledge:  Fair  Language:  Fair  Akathisia:  No  Handed:  Right  AIMS (if indicated):     Assets:  Communication Skills  ADL's:  Intact  Cognition:  WNL  Sleep:  Number of Hours: 5.5       Treatment Plan Summary: Zymiere Trostle is a 55 y.o. Male, who is married , employed as an Acupuncturist , who lives in [place] , with his wife and 2 kids , has a hx of depression , presented  voluntarily to [facility] accompanied by his wife Bruce [name] [phone], for worsening depression and psychosis.Pt will need inpatient stay.   Daily contact with patient to assess and evaluate symptoms and progress in treatment and Medication management   Patient will benefit from inpatient treatment and stabilization.  Estimated length of stay is 5-7 days.  Reviewed past medical records,treatment plan.  Pt will be started on Zoloft 25 mg po daily for depressive sx. Will add Seroquel 25 mg po qhs for sleep as well as psychosis. Will continue Rozerem 8 mg po qhs for sleep. Rozerem takes a few days to have its full effect. Will make available PRN medications as per agitation protocol. Will continue to monitor vitals ,medication compliance and treatment side effects while patient is here.  Will monitor for medical issues as well as call consult as needed.  Reviewed labs cbc - wnl, cmp - wnl , UDS- bzd pos ( prescribed) , will get CT scan head , tsh, lipid panel, hba1c, ekg for qtc. Patient does not yet know if writer should contact his wife to discuss treatment  plan - he will think about it and let writer know. CSW will start working on disposition.  Patient to participate in therapeutic milieu .       Observation Level/Precautions:  15 minute checks    Psychotherapy:  Individual and group therapy     Consultations: social worker  Discharge Concerns:  Stability and safety       I certify that inpatient services furnished can reasonably be expected to  improve the patient's condition.    Bruce Albano, MD 6/13/201712:01 PM

## 2016-05-22 NOTE — BHH Suicide Risk Assessment (Signed)
[facility]   Nursing information obtained from:  Patient Demographic factors:  Male, Caucasian Current Mental Status:  NA Loss Factors:  NA Historical Factors:  NA Risk Reduction Factors:  Sense of responsibility to family, Employed, Living with another person, especially a relative, Positive social support  Total Time spent with patient: 30 minutes Principal Problem: MDD (major depressive disorder), recurrent, severe, with psychosis (HCC) Diagnosis:   Patient Active Problem List   Diagnosis Date Noted  . MDD (major depressive disorder), recurrent, severe, with psychosis (HCC) [F33.3] 05/21/2016  . Low testosterone [E29.1] 05/18/2016  . Depression [F32.9] 04/03/2016  . Constipation [K59.00] 03/22/2016  . Insomnia [G47.00] 03/12/2016  . 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 Hypogonadism, male [E29.1] 02/17/2016  . Fatigue [R53.83] 11/28/2015  . Erectile dysfunction [N52.9] 11/28/2015  . Decreased libido [R68.82] 11/28/2015  . Snoring [R06.83] 11/28/2015  . Generalized anxiety disorder [F41.1] 11/28/2015  . Hypercholesteremia [E78.00]   . Vitamin D deficiency [E55.9]    Subjective Data: Please see H&P.   Continued Clinical Symptoms:  Alcohol Use Disorder Identification Test Final Score (AUDIT): 0 The "Alcohol Use Disorders Identification Test", Guidelines for Use in Primary Care, Second Edition.  World Science writerHealth Organization [facility](WHO). Score between 0-7:  no or low risk or alcohol related problems. Score between 8-15:  moderate risk of alcohol related problems. Score between 16-19:  high risk of alcohol related problems. Score 20 or above:  warrants further diagnostic evaluation for alcohol dependence and treatment.   CLINICAL FACTORS:   Previous Psychiatric Diagnoses and Treatments Medical Diagnoses and Treatments/Surgeries   Musculoskeletal: Strength & Muscle Tone: within normal limits Gait & Station: normal Patient leans:  N/A  Psychiatric Specialty Exam: Physical Exam  Review of Systems  Psychiatric/Behavioral: Positive for depression. The patient is nervous/anxious and has insomnia.   All other systems reviewed and are negative.   Blood pressure 107/66, pulse 95, temperature 98.3 F (36.8 C), temperature source Oral, resp. rate 18, height 5\' 11"  (1.803 m), weight 69.4 kg (153 lb), SpO2 100 %.Body mass index is 21.35 kg/(m^2).                  Please see H&P.                                         COGNITIVE FEATURES THAT CONTRIBUTE TO RISK:  Closed-mindedness, Polarized thinking and Thought constriction (tunnel vision)    SUICIDE RISK:   Moderate:  Frequent suicidal ideation with limited intensity, and duration, some specificity in terms of plans, no associated intent, good self-control, limited dysphoria/symptomatology, some risk factors present, and identifiable protective factors, including available and accessible social support.  PLAN OF CARE: Please see H&P.   I certify that inpatient services furnished can reasonably be expected to improve the patient's condition.   Novelle Addair, MD 05/22/2016, 11:40 AM

## 2016-05-23 LAB — LIPID PANEL
Cholesterol: 180 mg/dL (ref 0–200)
HDL: 38 mg/dL — ABNORMAL LOW (ref >40)
LDL CALC: 116 mg/dL — AB (ref 0–99)
Total CHOL/HDL Ratio: 4.7 RATIO
Triglycerides: 131 mg/dL (ref <150)
VLDL: 26 mg/dL (ref 0–40)

## 2016-05-23 LAB — TSH: TSH: 2.461 u[IU]/mL (ref 0.350–4.500)

## 2016-05-23 MED ORDER — QUETIAPINE FUMARATE 50 MG PO TABS
50.0000 mg | ORAL_TABLET | Freq: Every day | ORAL | Status: DC
  Administered 2016-05-23: 50 mg via ORAL
  Filled 2016-05-23 (×3): qty 1

## 2016-05-23 MED ORDER — SERTRALINE HCL 50 MG PO TABS
50.0000 mg | ORAL_TABLET | Freq: Every day | ORAL | Status: DC
  Administered 2016-05-24: 50 mg via ORAL
  Filled 2016-05-23 (×2): qty 1

## 2016-05-23 NOTE — Progress Notes (Signed)
Data Reports sleeping "good" with PRN sleep med.  Rates depression 0/10, hopelessness 2/10, and anxiety 2/10. Affect flat mood "anxious."  Denies HI, SI, AVH.  Patient had flat affect, laid in bed most of morning with eyes open but went outside for group.  Took medicines as ordered.  Patient says he wants to have a "positive day" and says he will "participate in program."   Minimal interaction with staff, none observed with peers.  Guarded.  Received two phone calls from wife- MD said he would contact wife today.  Wife requested that staff prompt patient to bathe before the visit.    Action Patient prompted to bathe before family visits.  Remained on 15 minute checks.  Encouraged to attend groups and spend time in mileu.  Response Remained in room most of day, attended groups with no input.  Denies concerns, remains safe on unit.

## 2016-05-23 NOTE — Tx Team (Signed)
Interdisciplinary Treatment Plan Update (Adult)  Date:  05/23/2016   Time Reviewed:  8:33 AM   Progress in Treatment: Attending groups: Yes. Participating in groups:  Yes. Taking medication as prescribed:  Yes. Tolerating medication:  Yes. Family/Significant other contact made:  Yes Patient understands diagnosis:  Yes  As evidenced by seeking help with "irrational thoughts" Discussing patient identified problems/goals with staff:  Yes, see initial care plan. Medical problems stabilized or resolved:  Yes. Denies suicidal/homicidal ideation: Yes. Issues/concerns per patient self-inventory:  No. Other:  New problem(s) identified:  Discharge Plan or Barriers: see below  Reason for Continuation of Hospitalization: Delusions  Depression Medication stabilization  Comments:  Patient is a 55 year old male past medical history of depression who presents to the ED with his wife with complaints of "irrational thoughts". Patient reports over the past few days he has felt more anxious and has been having difficulty sleeping. He reports having a fear that his wife is going to leave him.  Pt will be started on Zoloft 25 mg po daily for depressive sx. Will add Seroquel 25 mg po qhs for sleep as well as psychosis. Will continue Rozerem 8 mg po qhs for sleep. Rozerem takes a few days to have its full effect. Estimated length of stay: 4-5 days  New goal(s):  Review of initial/current patient goals per problem list:   Review of initial/current patient goals per problem list:  1. Goal(s): Patient will participate in aftercare plan   Met: Yes   Target date: 3-5 days post admission date   As evidenced by: Patient will participate within aftercare plan AEB aftercare provider and housing plan at discharge being identified. 05/23/16:  Return home, follow up outpt   2. Goal (s): Patient will exhibit decreased depressive symptoms and suicidal ideations.   Met: No   Target date: 3-5 days  post admission date   As evidenced by: Patient will utilize self rating of depression at 3 or below and demonstrate decreased signs of depression or be deemed stable for discharge by MD. 05/23/16:  Although he rates his depression at less than a 3, he presents with flat affect, grim, lack of energy.  Appears profoundly depressed.      5. Goal(s): Patient will demonstrate decreased signs of psychosis  * Met: No  * Target date: 3-5 days post admission date  * As evidenced by: Patient will demonstrate decreased frequency of AVH or return to baseline function 05/23/16:  Presents with delusions          Attendees: Patient:  05/23/2016 8:33 AM   Family:   05/23/2016 8:33 AM   Physician:  Ursula Alert, MD 05/23/2016 8:33 AM   Nursing:   Gerald Leitz, RN 05/23/2016 8:33 AM   CSW:    Roque Lias, LCSW   05/23/2016 8:33 AM   Other:  05/23/2016 8:33 AM   Other:   05/23/2016 8:33 AM   Other:  Lars Pinks, Nurse CM 05/23/2016 8:33 AM   Other:   05/23/2016 8:33 AM   Other:  Norberto Sorenson, [place]  05/23/2016 8:33 AM   Other:  05/23/2016 8:33 AM   Other:  05/23/2016 8:33 AM   Other:  05/23/2016 8:33 AM   Other:  05/23/2016 8:33 AM   Other:  05/23/2016 8:33 AM   Other:   05/23/2016 8:33 AM    Scribe for Treatment Team:   Trish Mage, 05/23/2016 8:33 AM

## 2016-05-23 NOTE — Progress Notes (Signed)
Patient's wife has called x 3 anxious to speak with MD, becoming angry. States, "I come from a medical family and my dad is a Development worker, communityphysician. We need information." Wife also asking that staff instruct patient to bathe before her visit this evening. Informed wife that Dr. Jama Flavorsobos was made aware of her request to speak to him in tx team today and he agreed to make contact with her after he sees patient. Explained Dr. Jama Flavorsobos is only physician on unit today and to bear with him as he makes his rounds. Also asked and encouraged patient to bathe, provided necessary supplies, however patient refuses. "I think I'm all right. Maybe I will just use a washcloth." Patient currently meeting with physician. Patient's wife informed and requested by staff she call back in 10 mins.

## 2016-05-23 NOTE — Progress Notes (Addendum)
[facility] MD Progress Note  05/23/2016 1:38 PM Bruce [name]  MRN:  [id number]  Subjective: Alesandro reports, "I'm doing well. I was feeling very anxious. That was why I came to the hospital, I'm doing better now".  Objective: Ceasar is seen, chart reviewed. He is alert, oriented & aware of situation. He is verbally responsive. He currently denies any SIHI, AVH, delusional thoughts or paranoia. He appears quiet, not responding to any internal stimuli. He appears to be in no apparent distress.  Principal Problem: MDD (major depressive disorder), recurrent, severe, with psychosis ([place])  Diagnosis:   Patient Active Problem List   Diagnosis Date Noted  . MDD (major depressive disorder), recurrent, severe, with psychosis ([place]) [F33.3] 05/21/2016  . Low testosterone [E29.1] 05/18/2016  . Depression [F32.9] 04/03/2016  . Constipation [K59.00] 03/22/2016  . Insomnia [G47.00] 03/12/2016  . 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 Hypogonadism, male [E29.1] 02/17/2016  . Fatigue [R53.83] 11/28/2015  . Erectile dysfunction [N52.9] 11/28/2015  . Decreased libido [R68.82] 11/28/2015  . Snoring [R06.83] 11/28/2015  . Generalized anxiety disorder [F41.1] 11/28/2015  . Hypercholesteremia [E78.00]   . Vitamin D deficiency [E55.9]    Total Time spent with patient: 25 minutes  Past Psychiatric History: Major depression  Past Medical History:  Past Medical History  Diagnosis Date  . Hypercholesteremia   . Vitamin D deficiency   . Depression 04/03/2016    Past Surgical History  Procedure Laterality Date  . Tonsillectomy     Family History:  Family History  Problem Relation Age of Onset  . Heart attack Father   . Hypothyroidism Mother   . Other Neg Hx     hypogonadism   Family Psychiatric  History: See H&P  Social History:  History  Alcohol Use No     History  Drug Use No    Social History   Social History  . Marital Status: Married    Spouse Name: N/A  . Number of Children: 4   . Years of Education: N/A   Occupational History  . engineer    Social History Main Topics  . Smoking status: Never Smoker   . Smokeless tobacco: None  . Alcohol Use: No  . Drug Use: No  . Sexual Activity: Yes   Other Topics Concern  . None   Social History Narrative   Additional Social History:    Pain Medications: pt denies abuse - see pta meds list Prescriptions: pt denies abuse - see pta med  Over the Counter: SEE MAR History of alcohol / drug use?: No history of alcohol / drug abuse Longest period of sobriety (when/how long): NA  Sleep: Good  Appetite:  Good  Current Medications: Current Facility-Administered Medications  Medication Dose Route Frequency Provider Last Rate Last Dose  . acetaminophen (TYLENOL) tablet 650 mg  650 mg Oral Q6H PRN Niel Hummer, NP      . alum & mag hydroxide-simeth (MAALOX/MYLANTA) 200-200-20 MG/5ML suspension 30 mL  30 mL Oral Q4H PRN Niel Hummer, NP      . LORazepam (ATIVAN) tablet 1 mg  1 mg Oral Q6H PRN Ursula Alert, MD       Or  . LORazepam (ATIVAN) injection 1 mg  1 mg Intramuscular Q6H PRN Ursula Alert, MD      . magnesium hydroxide (MILK OF MAGNESIA) suspension 30 mL  30 mL Oral Daily PRN Niel Hummer, NP      . OLANZapine zydis (ZYPREXA) disintegrating tablet 5 mg  5 mg  Oral Q8H PRN Ursula Alert, MD      . QUEtiapine (SEROQUEL) tablet 25 mg  25 mg Oral QHS Ursula Alert, MD   50 mg at 05/22/16 2159  . ramelteon (ROZEREM) tablet 8 mg  8 mg Oral QHS Niel Hummer, NP   8 mg at 05/22/16 2159  . sertraline (ZOLOFT) tablet 25 mg  25 mg Oral Daily Ursula Alert, MD   25 mg at 05/23/16 1660    Lab Results:  Results for orders placed or performed during the hospital encounter of 05/21/16 (from the past 48 hour(s))  TSH     Status: None   Collection Time: 05/23/16  6:15 AM  Result Value Ref Range   TSH 2.461 0.350 - 4.500 uIU/mL    Comment: Performed at [facility]  Lipid panel     Status: Abnormal    Collection Time: 05/23/16  6:15 AM  Result Value Ref Range   Cholesterol 180 0 - 200 mg/dL   Triglycerides 131 <150 mg/dL   HDL 38 (L) >40 mg/dL   Total CHOL/HDL Ratio 4.7 RATIO   VLDL 26 0 - 40 mg/dL   LDL Cholesterol 116 (H) 0 - 99 mg/dL    Comment:        Total Cholesterol/HDL:CHD Risk Coronary Heart Disease Risk Table                     Men   Women  1/2 Average Risk   3.4   3.3  Average Risk       5.0   4.4  2 X Average Risk   9.6   7.1  3 X Average Risk  23.4   11.0        Use the calculated Patient Ratio above and the CHD Risk Table to determine the patient's CHD Risk.        ATP III CLASSIFICATION (LDL):  <100     mg/dL   Optimal  100-129  mg/dL   Near or Above                    Optimal  130-159  mg/dL   Borderline  160-189  mg/dL   High  >190     mg/dL   Very High Performed at [facility]     Blood Alcohol level:  Lab Results  Component Value Date   [facility] <5 05/19/2016   ETH <5 05/17/2016    Physical Findings: AIMS: Facial and Oral Movements Muscles of Facial Expression: None, normal Lips and Perioral Area: None, normal Jaw: None, normal Tongue: None, normal,Extremity Movements Upper (arms, wrists, hands, fingers): None, normal Lower (legs, knees, ankles, toes): None, normal, Trunk Movements Neck, shoulders, hips: None, normal, Overall Severity Severity of abnormal movements (highest score from questions above): None, normal Incapacitation due to abnormal movements: None, normal Patient's awareness of abnormal movements (rate only patient's report): No Awareness, Dental Status Current problems with teeth and/or dentures?: No Does patient usually wear dentures?: No  CIWA:    COWS:     Musculoskeletal: Strength & Muscle Tone: within normal limits Gait & Station: normal Patient leans: N/A  Psychiatric Specialty Exam: Physical Exam  ROS  Blood pressure 128/84, pulse 100, temperature 98 F (36.7 C), temperature source Oral, resp. rate 16,  height 5' 11"  (1.803 m), weight 69.4 kg (153 lb), SpO2 100 %.Body mass index is 21.35 kg/(m^2).  General Appearance: Guarded  Eye Contact: Fair  Speech: Slow  Volume: Decreased  Mood: Depressed and Dysphoric  Affect: Depressed  Thought Process: Goal Directed and Descriptions of Associations: Intact  Orientation: Full (Time, Place, and Person)  Thought Content: Delusions, Paranoid Ideation and Rumination  Suicidal Thoughts: No may be a potential danger to self or others due to severe paranoid delusions  Homicidal Thoughts: No  Memory: Immediate; Fair Recent; Fair Remote; Fair  Judgement: Impaired  Insight: Shallow  Psychomotor Activity: Decreased  Concentration: Concentration: Fair and Attention Span: Fair  Recall: [employer] of Knowledge: Fair  Language: Fair  Akathisia: No  Handed: Right  AIMS (if indicated):    Assets: Communication Skills  ADL's: Intact  Cognition: WNL        Sleep:  Number of Hours: 6.75   Treatment Plan Summary: Daily contact with patient to assess and evaluate symptoms and progress in treatment  1. Continue crisis management & mood stabilization. 2. Continue current medication management to reduce current symptoms to base line and improve the  patient's overall level of functioning; Seroquel 25 mg for agitation, Sertraline 25 mg for depression, Continue the Olanzapine disintegration tablet 5 mg for mood control, Rozerem 8 mg for insomnia, Lorazepam 1 mg for severe anxiety. 3. Treat health problems as indicated; 4. Develop treatment plan to enhance medication adeherance upon discharge & prevent the need for  readmission. 5. Psycho-social education regarding relapse prevention and self care. 6. Will continue PRN treatment regimen per protocols. 7.Monitor vital signs, review pertinent findings or order labs when necessary. 8. Education officer, museum to work on discharge disposition  Lindell Spar I, NP,  PMHNP-BC 05/23/2016, 1:38 PM   I have reviewed case and met with patient x 25 minutes  Agree with NP note and assessment  I have also had, with patient's express consent and in his presence, phone conversation with his wife, and have obtained collateral information. As per wife, patient is normally high functioning , but over recent weeks to months has been worsening - symptoms include being depressed, less sociable, more withdrawn,  poor sleep, decreased appetite,  significant weight loss, and more recently paranoid ideations of her having an affair and more recent auditory hallucinations, which patient describes as " noises " rather than any identifiable voice. No prior history of severe depression, no history of mania, no prior history of psychosis identified. Had recently been started on Zoloft . No history of drug or alcohol abuse, had been taking low dose of Xanax at home prior to admission. No known medical issues, other than history of low testosterone for which he was being treated. A recent Head CT is unremarkable. 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 At this time fairly related, with a blunted affect , fairly  groomed, soft , limited speech, alert, oriented x 3, no presentation  of confusion or delirium at this time- mood is " a little better", denies suicidal ideations, denies any homicidal ideations, denies psychotic symptoms at present and does not appear internally preoccupied  Dx- Consider MDD with psychotic features, severe  PLAN- increase Zoloft to 50 mgrs QDAY , increase Seroquel to 50 mgrs QHS , obtain B12, Folate, Vitamin D level , as patient's wife reports he has history of Vit D deficiency in the past, and has been losing weight . Family meeting with wife , patient , and treatment team tentatively scheduled for Friday .   Gabriel Earing, MD

## 2016-05-23 NOTE — [facility] Group Notes (Signed)
[facility]  05/23/2016 , 2:18 PM    Type of Therapy:  [facility] Presentation  Participation Level:  Active  Participation Quality:  Attentive  Affect:  Blunted  Cognitive:  Oriented  Insight:  Limited  Engagement in Therapy:  Engaged  Modes of Intervention:  Discussion, Education and Socialization  Summary of Progress/Problems:  Onalee HuaDavid from [facility] came to present his recovery story and play the guitar.  Stayed the entrie time, engaged throughout.  Daryel Geraldorth, Teagen Bucio B 05/23/2016 , 2:18 PM

## 2016-05-24 LAB — VITAMIN B12: VITAMIN B 12: 268 pg/mL (ref 180–914)

## 2016-05-24 LAB — HEMOGLOBIN A1C
HEMOGLOBIN A1C: 5.4 % (ref 4.8–5.6)
Mean Plasma Glucose: 108 mg/dL

## 2016-05-24 LAB — FOLATE: FOLATE: 18.8 ng/mL (ref >5.9)

## 2016-05-24 MED ORDER — SERTRALINE HCL 50 MG PO TABS: 75.0000 mg | ORAL_TABLET | Freq: Every day | ORAL | Status: DC

## 2016-05-24 MED ORDER — SERTRALINE HCL 100 MG PO TABS
100.0000 mg | ORAL_TABLET | Freq: Every day | ORAL | Status: DC
  Administered 2016-05-25 – 2016-05-30 (×6): 100 mg via ORAL
  Filled 2016-05-24 (×7): qty 1

## 2016-05-24 MED ORDER — QUETIAPINE FUMARATE 100 MG PO TABS
100.0000 mg | ORAL_TABLET | Freq: Every day | ORAL | Status: DC
  Administered 2016-05-24: 100 mg via ORAL
  Filled 2016-05-24 (×3): qty 1

## 2016-05-24 NOTE — Progress Notes (Signed)
D: Pt visible in milieu at intervals during shift. Presents with flat affect and depressed mood. Reported fair sleep last night with good appetite when assessed. Rated his depression 0/10, hopelessness 1/10 and anxiety 1/10 on self inventory sheet.   A: Scheduled medications administered as prescribed. Support and availability provided to pt. Writer encouraged pt to voice concerns / needs throughout this shift. Q 15 minutes checks maintained for safety as ordered without outburst or self harm gestures to note thus far. R: Pt A & O X3, denies SI, HI, AVH and pain when assessed. Compliant with medications as ordered when offered. Denies adverse drug reactions thus far. Attended scheduled groups. Remains safe on unit and off unit.

## 2016-05-24 NOTE — Plan of Care (Signed)
Problem: Health Behavior/Discharge Planning: Goal: Compliance with prescribed medication regimen will improve Outcome: Progressing Pt compliant with medications as prescribed. Denies adverse side effects when assessed.   Problem: Activity: Goal: Interest or engagement in activities will improve Outcome: Not Progressing Pt is isolative to his room. Pt did not engage in activities with peers. Minimal interaction with staff on as need basis.

## 2016-05-24 NOTE — Progress Notes (Signed)
   D: Pt attended group.When asked about his day, pt stated, "ok" very softly. When asked if his visit went well pt stated, "yes".  Pt didn't engage the writer in conversation other than to answer questions. Pt has no questions or concerns.    A:  Support and encouragement was offered. 15 min checks continued for safety.  R: Pt remains safe.

## 2016-05-24 NOTE — Progress Notes (Addendum)
Patient ID: Bruce [name], male   DOB: 18-Sep-1961, 55 y.o.   MRN: [id number] [facility] MD Progress Note  05/24/2016 12:38 PM Bruce [name]  MRN:  [id number]  Subjective: Bruce [name] reports, "I'm good. It has been a good day so far. The anxiety is getting better. I will need a counselor after discharge to talk about things to help decrease the anxiety".  Objective: Bruce [name] is seen, chart reviewed. He is alert, oriented & aware of situation. He is verbally responsive. He currently denies any SIHI, AVH, delusional thoughts or paranoia. He appears quiet, not responding to any internal stimuli. He appears to be in no apparent distress. However, presents with flat affects, slow appearance. Sertraline dose is increased to meet his needs.  Principal Problem: MDD (major depressive disorder), recurrent, severe, with psychosis ([place])  Diagnosis:   Patient Active Problem List   Diagnosis Date Noted  . MDD (major depressive disorder), recurrent, severe, with psychosis ([place]) [F33.3] 05/21/2016  . Low testosterone [E29.1] 05/18/2016  . Depression [F32.9] 04/03/2016  . Constipation [K59.00] 03/22/2016  . Insomnia [G47.00] 03/12/2016  . 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 Hypogonadism, male [E29.1] 02/17/2016  . Fatigue [R53.83] 11/28/2015  . Erectile dysfunction [N52.9] 11/28/2015  . Decreased libido [R68.82] 11/28/2015  . Snoring [R06.83] 11/28/2015  . Generalized anxiety disorder [F41.1] 11/28/2015  . Hypercholesteremia [E78.00]   . Vitamin D deficiency [E55.9]    Total Time spent with patient: 25 minutes  Past Psychiatric History: Major depression  Past Medical History:  Past Medical History  Diagnosis Date  . Hypercholesteremia   . Vitamin D deficiency   . Depression 04/03/2016    Past Surgical History  Procedure Laterality Date  . Tonsillectomy     Family History:  Family History  Problem Relation Age of Onset  . Heart attack Father   . Hypothyroidism Mother   . Other Neg Hx      hypogonadism   Family Psychiatric  History: See H&P  Social History:  History  Alcohol Use No     History  Drug Use No    Social History   Social History  . Marital Status: Married    Spouse Name: N/A  . Number of Children: 4  . Years of Education: N/A   Occupational History  . engineer    Social History Main Topics  . Smoking status: Never Smoker   . Smokeless tobacco: None  . Alcohol Use: No  . Drug Use: No  . Sexual Activity: Yes   Other Topics Concern  . None   Social History Narrative   Additional Social History:    Pain Medications: pt denies abuse - see pta meds list Prescriptions: pt denies abuse - see pta med  Over the Counter: SEE MAR History of alcohol / drug use?: No history of alcohol / drug abuse Longest period of sobriety (when/how long): NA  Sleep: Good  Appetite:  Good  Current Medications: Current Facility-Administered Medications  Medication Dose Route Frequency Provider Last Rate Last Dose  . acetaminophen (TYLENOL) tablet 650 mg  650 mg Oral Q6H PRN Niel Hummer, NP      . alum & mag hydroxide-simeth (MAALOX/MYLANTA) 200-200-20 MG/5ML suspension 30 mL  30 mL Oral Q4H PRN Niel Hummer, NP      . LORazepam (ATIVAN) tablet 1 mg  1 mg Oral Q6H PRN Ursula Alert, MD       Or  . LORazepam (ATIVAN) injection 1 mg  1 mg Intramuscular Q6H PRN Saramma Eappen,  MD      . magnesium hydroxide (MILK OF MAGNESIA) suspension 30 mL  30 mL Oral Daily PRN Niel Hummer, NP      . OLANZapine zydis (ZYPREXA) disintegrating tablet 5 mg  5 mg Oral Q8H PRN Saramma Eappen, MD      . QUEtiapine (SEROQUEL) tablet 50 mg  50 mg Oral QHS Jenne Campus, MD   50 mg at 05/23/16 2138  . ramelteon (ROZEREM) tablet 8 mg  8 mg Oral QHS Niel Hummer, NP   8 mg at 05/23/16 2138  . [START ON 05/25/2016] sertraline (ZOLOFT) tablet 75 mg  75 mg Oral Daily Encarnacion Slates, NP        Lab Results:  Results for orders placed or performed during the hospital encounter of  05/21/16 (from the past 48 hour(s))  TSH     Status: None   Collection Time: 05/23/16  6:15 AM  Result Value Ref Range   TSH 2.461 0.350 - 4.500 uIU/mL    Comment: Performed at [facility]  Lipid panel     Status: Abnormal   Collection Time: 05/23/16  6:15 AM  Result Value Ref Range   Cholesterol 180 0 - 200 mg/dL   Triglycerides 131 <150 mg/dL   HDL 38 (L) >40 mg/dL   Total CHOL/HDL Ratio 4.7 RATIO   VLDL 26 0 - 40 mg/dL   LDL Cholesterol 116 (H) 0 - 99 mg/dL    Comment:        Total Cholesterol/HDL:CHD Risk Coronary Heart Disease Risk Table                     Men   Women  1/2 Average Risk   3.4   3.3  Average Risk       5.0   4.4  2 X Average Risk   9.6   7.1  3 X Average Risk  23.4   11.0        Use the calculated Patient Ratio above and the CHD Risk Table to determine the patient's CHD Risk.        ATP III CLASSIFICATION (LDL):  <100     mg/dL   Optimal  100-129  mg/dL   Near or Above                    Optimal  130-159  mg/dL   Borderline  160-189  mg/dL   High  >190     mg/dL   Very High Performed at [facility]   Hemoglobin A1c     Status: None   Collection Time: 05/23/16  6:15 AM  Result Value Ref Range   Hgb A1c MFr Bld 5.4 4.8 - 5.6 %    Comment: (NOTE)         Pre-diabetes: 5.7 - 6.4         Diabetes: >6.4         Glycemic control for adults with diabetes: <7.0    Mean Plasma Glucose 108 mg/dL    Comment: (NOTE) Performed At: [facility] [place], [place] [id number] Bruce Romp MD [id number] Performed at [facility]   Vitamin B12     Status: None   Collection Time: 05/24/16  6:23 AM  Result Value Ref Range   Vitamin B-12 268 180 - 914 pg/mL    Comment: (NOTE) This assay is not validated for testing neonatal or myeloproliferative  syndrome specimens for Vitamin B12 levels. Performed at [facility]   Folate     Status: None   Collection Time: 05/24/16  6:23 AM   Result Value Ref Range   Folate 18.8 >5.9 ng/mL    Comment: Performed at [facility]   Blood Alcohol level:  Lab Results  Component Value Date   [facility] <5 05/19/2016   ETH <5 05/17/2016    Physical Findings: AIMS: Facial and Oral Movements Muscles of Facial Expression: None, normal Lips and Perioral Area: None, normal Jaw: None, normal Tongue: None, normal,Extremity Movements Upper (arms, wrists, hands, fingers): None, normal Lower (legs, knees, ankles, toes): None, normal, Trunk Movements Neck, shoulders, hips: None, normal, Overall Severity Severity of abnormal movements (highest score from questions above): None, normal Incapacitation due to abnormal movements: None, normal Patient's awareness of abnormal movements (rate only patient's report): No Awareness, Dental Status Current problems with teeth and/or dentures?: No Does patient usually wear dentures?: No  CIWA:    COWS:     Musculoskeletal: Strength & Muscle Tone: within normal limits Gait & Station: normal Patient leans: N/A  Psychiatric Specialty Exam: Physical Exam  ROS  Blood pressure 114/70, pulse 96, temperature 97.9 F (36.6 C), temperature source Oral, resp. rate 16, height 5' 11"  (1.803 m), weight 69.4 kg (153 lb), SpO2 100 %.Body mass index is 21.35 kg/(m^2).  General Appearance: Guarded, slow, dull.  Eye Contact: Fair  Speech:Slow, clear, not spontaneous.  Volume: Decreased  Mood: Depressed, flat affect  Affect: Depressed, flat  Thought Process: Goal Directed and Descriptions of Associations: Intact  Orientation: Full (Time, Place, and Person)  Thought Content: Delusions, Paranoid Ideation and Rumination  Suicidal Thoughts: No, may be a potential danger to self or others due to severe paranoid delusions  Homicidal Thoughts: No  Memory: Immediate; Fair Recent; Fair Remote; Fair  Judgement:Fair  Insight: Shallow  Psychomotor Activity: Decreased   Concentration: Concentration: Fair and Attention Span: Fair  Recall: [employer] of Knowledge: Fair  Language: Fair  Akathisia: No  Handed: Right  AIMS (if indicated):    Assets: Communication Skills  ADL's: Intact  Cognition: WNL        Sleep:  Number of Hours: 6.75   Treatment Plan Summary: Daily contact with patient to assess and evaluate symptoms and progress in treatment  1. Continue crisis management & mood stabilization, patient continue to present very depressed. 2. Continue current medication management to reduce current symptoms to base line and improve the  patient's overall level of functioning; Increased Seroquel to 100 mg for agitation/mood control, increased Sertraline to 100 mg for depression, Continue the Olanzapine disintegration tablet 5 mg prn for mood control, Rozerem 8 mg for insomnia, Lorazepam 1 mg for severe anxiety. 3. Treat health problems as indicated; 4. Develop treatment plan to enhance medication adeherance upon discharge & prevent the need for  readmission. 5. Psycho-social education regarding mental health mainatenance and self care. 6. Will continue PRN treatment regimen per protocols. 7.Monitor vital signs, review pertinent findings or order labs when necessary. 8. Education officer, museum to work on discharge disposition  Lindell Spar I, NP, PMHNP-BC 05/24/2016, 12:38 PM   I have reviewed case and met with patient x 25 minutes  Agree with NP note and assessment  I have also had, with patient's express consent and in his presence, phone conversation with his wife, and have obtained collateral information. As per wife, patient is normally high functioning , but over recent weeks to months has been  worsening - symptoms include being depressed, less sociable, more withdrawn,  poor sleep, decreased appetite,  significant weight loss, and more recently paranoid ideations of her having an affair and more recent auditory hallucinations, which  patient describes as " noises " rather than any identifiable voice. No prior history of severe depression, no history of mania, no prior history of psychosis identified. Had recently been started on Zoloft . No history of drug or alcohol abuse, had been taking low dose of Xanax at home prior to admission. No known medical issues, other than history of low testosterone for which he was being treated. A recent Head CT is unremarkable. 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 At this time fairly related, with a blunted affect , fairly  groomed, soft , limited speech, alert, oriented x 3, no presentation  of confusion or delirium at this time- mood is " a little better", denies suicidal ideations, denies any homicidal ideations, denies psychotic symptoms at present and does not appear internally preoccupied  Dx- Consider MDD with psychotic features, severe  PLAN- increase Zoloft to 50 mgrs QDAY , increase Seroquel to 50 mgrs QHS , obtain B12, Folate, Vitamin D level , as patient's wife reports he has history of Vit D deficiency in the past, and has been losing weight . Family meeting with wife , patient , and treatment team tentatively scheduled for Friday .   Gabriel Earing, MD

## 2016-05-24 NOTE — [facility] Group Notes (Signed)
[facility] Group Notes:  (Counselor/Nursing/MHT/Case Management/Adjunct)  05/24/2016 1:15PM  Type of Therapy:  Group Therapy  Participation Level:  Active  Participation Quality:  Appropriate  Affect:  Flat  Cognitive:  Oriented  Insight:  Improving  Engagement in Group:  Limited  Engagement in Therapy:  Limited  Modes of Intervention:  Discussion, Exploration and Socialization  Summary of Progress/Problems: The topic for group was balance in life.  Pt participated in the discussion about when their life was in balance and out of balance and how this feels.  Pt discussed ways to get back in balance and short term goals they can work on to get where they want to be.  Stayed the entire time, engaged throughout.  Stated he feels he is getting balanced, but not quite there.  Defines balance as "having a positive outlook, feeling more optimistic,"  And admits he has not been in that state for awhile.  As to what brings him balance, he identified spending time with his loved ones-family members-as the thing that works best.  Bruce OvensNorth, Bruce [name] 05/24/2016 4:15 PM

## 2016-05-24 NOTE — Progress Notes (Signed)
  D MHT found a seroquel 50 mg on the bed with the pt this morning. Writer spoke with pt about holding meds. Asked pt if there was a reason he didn't take his med and he replied "no".   A: Encouraged pt to take all his scheduled meds and inform nurse if he decides he doesn't want a medication. Support and encouragement was offered. 15 min checks continued for safety.  R: Pt remains safe.

## 2016-05-24 NOTE — Progress Notes (Signed)
D- Patient presents in a depressed mood with a flat affect.  Patient had brighter affect during visitation with his wife and daughter.  Patient currently denies SI, HI, AVH, and pain. Patient observed minimally interacting with staff.  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  Patient demonstrated paranoid behavior during bedtime medication pass.  Patient requested to see the medication package and read the name of the medication.  Patient requested to recheck the packages 2-3 times, read the name of the medications, and inspected medication to see that the medication was sealed. No complaints. A- Scheduled medications administered to patient, per MD orders. Support and encouragement provided.  Routine safety checks conducted every 15 minutes.  Patient informed to notify staff with problems or concerns. R- No adverse drug reactions noted. Patient contracts for safety at this time. Patient compliant with medications and treatment plan. Patient receptive, calm, and cooperative.  Patient remains safe at this time.

## 2016-05-25 LAB — VITAMIN D 25 HYDROXY (VIT D DEFICIENCY, FRACTURES): VIT D 25 HYDROXY: 20.3 ng/mL — AB (ref 30.0–100.0)

## 2016-05-25 MED ORDER — VITAMIN D3 25 MCG (1000 UNIT) PO TABS
1000.0000 [IU] | ORAL_TABLET | Freq: Every day | ORAL | Status: DC
  Administered 2016-05-25 – 2016-05-30 (×6): 1000 [IU] via ORAL
  Filled 2016-05-25 (×8): qty 1

## 2016-05-25 MED ORDER — OLANZAPINE 5 MG PO TBDP
5.0000 mg | ORAL_TABLET | Freq: Every day | ORAL | Status: DC
  Administered 2016-05-25 – 2016-05-29 (×5): 5 mg via ORAL
  Filled 2016-05-25 (×7): qty 1

## 2016-05-25 MED ORDER — OLANZAPINE 5 MG PO TBDP: 5.0000 mg | ORAL_TABLET | Freq: Two times a day (BID) | ORAL | Status: DC | PRN

## 2016-05-25 NOTE — [facility] Group Notes (Signed)
[facility] LCSW Group Therapy  05/25/2016  1:05 PM  Type of Therapy:  Group therapy  Participation Level:  Active  Participation Quality:  Attentive  Affect:  Flat  Cognitive:  Oriented  Insight:  Limited  Engagement in Therapy:  Limited  Modes of Intervention:  Discussion, Socialization  Summary of Progress/Problems:  Chaplain was here to lead a group on themes of hope and courage. Invited.  Chose to sit out "to get ready for my family session."  Bruce [name], Bruce [name] 05/25/2016 1:06 PM

## 2016-05-25 NOTE — [facility] Group Notes (Signed)
[facility]   05/25/2016 10:56 AM  Participation Quality:  Engaged  Mood/Affect:  Depressed  Depression Rating:    Anxiety Rating:    Thoughts of Suicide:  No Will you contract for safety?   Yes  Current AVH:  No  Plan for Discharge/Comments:  States he is no longer having "illogical thoughts."  Feels that coming here has been helpful in that his changed his perspective and given him time away to reflect.  States wife and 2 children visited last night.  Transportation Means:   Supports:  Daryel GeraldNorth, Rane Dumm B

## 2016-05-25 NOTE — Progress Notes (Signed)
Patient ID: Bruce [name], male   DOB: [birth year], 55 y.o.   MRN: [id number] Patient ID: Bruce [name], male   DOB: 1961/10/20, 54 y.o.   MRN: [id number] [facility] MD Progress Note  05/25/2016 2:33 PM Ender Rorke  MRN:  [id number]  Subjective: Holdyn reports, "My mood is pretty good today. I do have a lot of things to do at home & at work. I'm attending groups, taking the medicines. I have not felt any side effects".  Objective: Jimy is seen, chart reviewed. He is alert, oriented & aware of situation. He is verbally responsive. He currently denies any SIHI, AVH, delusional thoughts or paranoia. He appears quiet, not responding to any internal stimuli. He appears to be in no apparent distress. However, presents with flat affects, slow appearance. Sertraline & Seroquel doses increased to meet his needs.  Principal Problem: MDD (major depressive disorder), recurrent, severe, with psychosis ([place])  Diagnosis:   Patient Active Problem List   Diagnosis Date Noted  . MDD (major depressive disorder), recurrent, severe, with psychosis ([place]) [F33.3] 05/21/2016  . Low testosterone [E29.1] 05/18/2016  . Depression [F32.9] 04/03/2016  . Constipation [K59.00] 03/22/2016  . Insomnia [G47.00] 03/12/2016  . 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 Hypogonadism, male [E29.1] 02/17/2016  . Fatigue [R53.83] 11/28/2015  . Erectile dysfunction [N52.9] 11/28/2015  . Decreased libido [R68.82] 11/28/2015  . Snoring [R06.83] 11/28/2015  . Generalized anxiety disorder [F41.1] 11/28/2015  . Hypercholesteremia [E78.00]   . Vitamin D deficiency [E55.9]    Total Time spent with patient: 15 minutes  Past Psychiatric History: Major depression  Past Medical History:  Past Medical History  Diagnosis Date  . Hypercholesteremia   . Vitamin D deficiency   . Depression 04/03/2016    Past Surgical History  Procedure Laterality Date  . Tonsillectomy     Family History:  Family History  Problem Relation Age of  Onset  . Heart attack Father   . Hypothyroidism Mother   . Other Neg Hx     hypogonadism   Family Psychiatric  History: See H&P  Social History:  History  Alcohol Use No     History  Drug Use No    Social History   Social History  . Marital Status: Married    Spouse Name: N/A  . Number of Children: 4  . Years of Education: N/A   Occupational History  . engineer    Social History Main Topics  . Smoking status: Never Smoker   . Smokeless tobacco: None  . Alcohol Use: No  . Drug Use: No  . Sexual Activity: Yes   Other Topics Concern  . None   Social History Narrative   Additional Social History:    Pain Medications: pt denies abuse - see pta meds list Prescriptions: pt denies abuse - see pta med  Over the Counter: SEE MAR History of alcohol / drug use?: No history of alcohol / drug abuse Longest period of sobriety (when/how long): NA  Sleep: Good  Appetite:  Good  Current Medications: Current Facility-Administered Medications  Medication Dose Route Frequency Provider Last Rate Last Dose  . acetaminophen (TYLENOL) tablet 650 mg  650 mg Oral Q6H PRN Niel Hummer, NP      . alum & mag hydroxide-simeth (MAALOX/MYLANTA) 200-200-20 MG/5ML suspension 30 mL  30 mL Oral Q4H PRN Niel Hummer, NP      . LORazepam (ATIVAN) tablet 1 mg  1 mg Oral Q6H PRN Ursula Alert, MD  Or  . LORazepam (ATIVAN) injection 1 mg  1 mg Intramuscular Q6H PRN Ursula Alert, MD      . magnesium hydroxide (MILK OF MAGNESIA) suspension 30 mL  30 mL Oral Daily PRN Niel Hummer, NP      . OLANZapine zydis (ZYPREXA) disintegrating tablet 5 mg  5 mg Oral Q8H PRN Saramma Eappen, MD      . QUEtiapine (SEROQUEL) tablet 100 mg  100 mg Oral QHS Encarnacion Slates, NP   100 mg at 05/24/16 2124  . ramelteon (ROZEREM) tablet 8 mg  8 mg Oral QHS Niel Hummer, NP   8 mg at 05/24/16 2123  . sertraline (ZOLOFT) tablet 100 mg  100 mg Oral Daily Encarnacion Slates, NP   100 mg at 05/25/16 6015    Lab  Results:  Results for orders placed or performed during the hospital encounter of 05/21/16 (from the past 48 hour(s))  Vitamin B12     Status: None   Collection Time: 05/24/16  6:23 AM  Result Value Ref Range   Vitamin B-12 268 180 - 914 pg/mL    Comment: (NOTE) This assay is not validated for testing neonatal or myeloproliferative syndrome specimens for Vitamin B12 levels. Performed at [facility]   Folate     Status: None   Collection Time: 05/24/16  6:23 AM  Result Value Ref Range   Folate 18.8 >5.9 ng/mL    Comment: Performed at [facility] D 25 Hydroxy (Vit-D Deficiency, Fractures)     Status: Abnormal   Collection Time: 05/24/16  6:23 AM  Result Value Ref Range   Vit D, 25-Hydroxy 20.3 (L) 30.0 - 100.0 ng/mL    Comment: (NOTE) Vitamin D deficiency has been defined by the Institute of Medicine and an Endocrine Society practice guideline as a level of serum 25-OH vitamin D less than 20 ng/mL (1,2). The Endocrine Society went on to further define vitamin D insufficiency as a level between 21 and 29 ng/mL (2). 1. IOM (Institute of Medicine). 2010. Dietary reference   intakes for calcium and D. [place]: The   [employer]. 2. Holick MF, Binkley Lyndon, Bischoff-Ferrari HA, et al.   Evaluation, treatment, and prevention of vitamin D   deficiency: an Endocrine Society clinical practice   guideline. JCEM. 2011 Jul; 96(7):1911-30. Performed At: [facility] [place], [place] [id number] Lindon Romp MD [id number] Performed at [facility]    Blood Alcohol level:  Lab Results  Component Value Date   [facility] <5 05/19/2016   ETH <5 05/17/2016   Physical Findings: AIMS: Facial and Oral Movements Muscles of Facial Expression: None, normal Lips and Perioral Area: None, normal Jaw: None, normal Tongue: None, normal,Extremity Movements Upper (arms, wrists, hands, fingers): None, normal Lower  (legs, knees, ankles, toes): None, normal, Trunk Movements Neck, shoulders, hips: None, normal, Overall Severity Severity of abnormal movements (highest score from questions above): None, normal Incapacitation due to abnormal movements: None, normal Patient's awareness of abnormal movements (rate only patient's report): No Awareness, Dental Status Current problems with teeth and/or dentures?: No Does patient usually wear dentures?: No  CIWA:    COWS:     Musculoskeletal: Strength & Muscle Tone: within normal limits Gait & Station: normal Patient leans: N/A  Psychiatric Specialty Exam: Physical Exam  ROS  Blood pressure 105/76, pulse 96, temperature 98.5 F (36.9 C), temperature source Oral, resp. rate 20, height _0  (1.803 m), weight  69.4 kg (153 lb), SpO2 100 %.Body mass index is 21.35 kg/(m^2).  General Appearance: Guarded, slow, dull.  Eye Contact: Fair  Speech:Slow, clear, not spontaneous.  Volume: Decreased  Mood: Depressed, flat affect  Affect: Depressed, flat  Thought Process: Goal Directed and Descriptions of Associations: Intact  Orientation: Full (Time, Place, and Person)  Thought Content: Delusions, Paranoid Ideation and Rumination  Suicidal Thoughts: Denies  Homicidal Thoughts: Denies  Memory: Immediate; Fair Recent; Fair Remote; Fair  Judgement:Fair  Insight: Shallow  Psychomotor Activity: Decreased  Concentration: Concentration: Fair and Attention Span: Fair  Recall: [employer] of Knowledge: Fair  Language: Fair  Akathisia: No  Handed: Right  AIMS (if indicated):    Assets: Communication Skills  ADL's: Intact  Cognition: WNL        Sleep:  Number of Hours: 5.75   Treatment Plan Summary: Daily contact with patient to assess and evaluate symptoms and progress in treatment  1. Continue crisis management & mood stabilization, patient continue to present very depressed. 2. Continue current medication  management to reduce current symptoms to base line and improve the  patient's overall level of functioning; continue Seroquel 100 mg for agitation/mood control, continue Sertraline 100 mg for depression, Continue the Olanzapine disintegration tablet 5 mg prn for mood control, Rozerem 8 mg for insomnia, Lorazepam 1 mg for severe anxiety. 3. Treat health problems as indicated; 4. Develop treatment plan to enhance medication adeherance upon discharge & prevent the need for  readmission. 5. Psycho-social education regarding mental health mainatenance and self care. 6. Will continue PRN treatment regimen per protocols. 7.Monitor vital signs, review pertinent findings or order labs when necessary. 8. Social Worker to work on discharge disposition, family to meet with Dr. Parke Poisson today.  Lindell Spar I, NP, PMHNP-BC 05/25/2016, 2:33 PM   05/25/16- 5,40 PM  Addendum - I have  Reviewed case with NP and have met with patient. Agree with NP note and assessment as above . Also today had family meeting - duration 30 minutes- with family. Patient's adult children, wife , and mother came , and met with patient , Probation officer. 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 Symptoms of depression, deterioration were subtle at first, but have been going on for several months, recently becoming more clear .  Symptoms include decreased socialization, decreased communication with others, more difficulties at work , severe depression, weight loss, loss of interest, and recent paranoid ideations that wife has been having an affair with a man that was in the home but he could not see . Patient had been in treatment with clomiphene and testosterone for low testosterone, but family members now  think that symptoms that led to working up testosterone were actually depressive symptoms . Clomiphene was stopped recently due to concern it  could have been contributing to psychiatric symptoms. Wife expressed concern that patient has been non compliant with medications and seems to lack insight regarding the importance of taking these medications in order to improve . As discussed with Nursing Staff patient has been reluctant to take medications , found to be " cheeking " and spitting out medications yesterday, and presents paranoid about being in hospital and inpatient setting , making statement " you know better than me why I am here, it is all a game ".  Today patient presents fairly groomed, improved eye contact, limited, soft speech but a little more conversant today, although still answering  questions with mono-syllables and short phrases, acknowledges depression, affect blunted but smiles briefly at times, no clear thought disorder, denies hallucinations, not internally preoccupied, makes statements suggestive of paranoid ideations, delusional ideations . Alert and oriented at this time Labs- Vitamin D is low . Plan- as discussed with family, patient  D/C Seroquel  Start Zyprexa Zidis 5 mgrs QHS  Continue Zoloft 100 mgrs QDAY  Start Vitamin D supplementation  Consider inpatient ECT referral if no significant, prompt improvement with current medication regimen.   Gabriel Earing MD

## 2016-05-25 NOTE — Progress Notes (Signed)
D: Patient is A&Ox4, denies SI/HI and AVH. Patient interacts minimally, maintains minimal eye contact, flat affect, and presents as depressed.  A: patient is given meds as scheduled and prn. Continue monitoring q15 minutes and prn for safety. R: patient remains safe. Patient verbalized understanding to let staff know if he no longer feels safe.  Fynn Vanblarcom, Wyman SongsterAngela Marie, RN

## 2016-05-25 NOTE — Progress Notes (Signed)
D: Patient spoke to doctor regarding some paranoid beliefs, stated the family session was staged. Patient then told RN that the family session went well and everything is going great for him. Very minimal eye contact and extremely guarded towards nursing staff.  A: Continue giving meds as scheduled and prn. Continue q15 minutes checks for safety. R: Patient remains safe.  Praneel Haisley, Wyman SongsterAngela Marie, RN

## 2016-05-25 NOTE — [facility] Suicide Risk Assessment (Signed)
[facility] INPATIENT:  Family/Significant Other Suicide Prevention Education  Suicide Prevention Education:  Education Completed; No one  has been identified by the patient as the family member/significant other with whom the patient will be residing, and identified as the person(s) who will aid the patient in the event of a mental health crisis (suicidal ideations/suicide attempt).  With written consent from the patient, the family member/significant other has been provided the following suicide prevention education, prior to the and/or following the discharge of the patient.  The suicide prevention education provided includes the following:  Suicide risk factors  Suicide prevention and interventions  National Suicide Hotline telephone number  [facility] telephone number  [facility] Assistance 911  [facility] and/or [facility]idential [facility] telephone number  Request made of family/significant other to:  Remove weapons (e.g., guns, rifles, knives), all items previously/currently identified as safety concern.    Remove drugs/medications (over-the-counter, prescriptions, illicit drugs), all items previously/currently identified as a safety concern.  The family member/significant other verbalizes understanding of the suicide prevention education information provided.  The family member/significant other agrees to remove the items of safety concern listed above. The patient did not endorse SI at the time of admission, nor did the patient c/o SI during the stay here.  SPE not required. However, i did talk with wife Bruce [name], [phone], about a crises plan.  Bruce [name], Bruce [name] B 05/25/2016, 12:07 PM

## 2016-05-25 NOTE — Progress Notes (Signed)
Patient ID: [name] Wessinger, male   DOB: Aug 17, 1961, 55 y.o.   MRN: [id number] D: Client has visit from wife and daughter, seemed to be favorable as he sat between them both smiling from the attention. After wife left, client stayed around the nurses station, smiling, laughing inappropriately. Client appeared to be responding to internal stimuli. Client nods mostly when spoken to, forwards little. A: Writer provided emotional support, offered towels for shower. Reviewed medications, administered as prescribed. After administering medications and checking for cheeking client responds "you know I don't believe a word you said" When client encouraged client to trust he exclaimed "I don't trust no one but Jesus" Staff will monitor q615min for safety. R: Client is safe on the unit.

## 2016-05-26 NOTE — Progress Notes (Signed)
[facility]  05/26/2016 2:19 PM Bruce [name]  MRN:  [id number]  Subjective: Patient reports " I think I am okay, Maybe a little depressed."  Objective: Bruce [name] is awake, alert and oriented X4. Seen resting in bedroom. Patient appears flat and guarded. Denies suicidal or homicidal ideation. Denies auditory or visual hallucination and does not appear to be responding to internal stimuli. Patient reports he is medication compliant without mediation side effects. Patient reports history of depression and is still feeling the same. States his depression 2/10. Reports "I am having more anxiety than average here lately." States is anxiety in 7/10 most days however is feeling fine this morning. Reports good appetite and resting well. Patient reports he is excited to see his wife today. Support, encouragement and reassurance was provided.   Principal Problem: MDD (major depressive disorder), recurrent, severe, with psychosis ([facility])  Diagnosis:   Patient Active Problem List   Diagnosis Date Noted  . MDD (major depressive disorder), recurrent, severe, with psychosis ([facility]) [F33.3] 05/21/2016  . Low testosterone [E29.1] 05/18/2016  . Depression [F32.9] 04/03/2016  . Constipation [K59.00] 03/22/2016  . Insomnia [G47.00] 03/12/2016  . 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 Hypogonadism, male [E29.1] 02/17/2016  . Fatigue [R53.83] 11/28/2015  . Erectile dysfunction [N52.9] 11/28/2015  . Decreased libido [R68.82] 11/28/2015  . Snoring [R06.83] 11/28/2015  . Generalized anxiety disorder [F41.1] 11/28/2015  . Hypercholesteremia [E78.00]   . Vitamin D deficiency [E55.9]    Total Time spent with patient: 15 minutes  Past Psychiatric History: Major depression  Past Medical History:  Past Medical History  Diagnosis Date  . Hypercholesteremia   . Vitamin D deficiency   . Depression 04/03/2016    Past Surgical History  Procedure Laterality Date  . Tonsillectomy     Family  History:  Family History  Problem Relation Age of Onset  . Heart attack Father   . Hypothyroidism Mother   . Other Neg Hx     hypogonadism   Family Psychiatric  History: See H&P  Social History:  History  Alcohol Use No     History  Drug Use No    Social History   Social History  . Marital Status: Married    Spouse Name: N/A  . Number of Children: 4  . Years of Education: N/A   Occupational History  . engineer    Social History Main Topics  . Smoking status: Never Smoker   . Smokeless tobacco: None  . Alcohol Use: No  . Drug Use: No  . Sexual Activity: Yes   Other Topics Concern  . None   Social History Narrative   Additional Social History:    Pain Medications: pt denies abuse - see pta meds list Prescriptions: pt denies abuse - see pta med  Over the Counter: SEE MAR History of alcohol / drug use?: No history of alcohol / drug abuse Longest period of sobriety (when/how long): NA  Sleep: Good  Appetite:  Good  Current Medications: Current Facility-Administered Medications  Medication Dose Route Frequency Provider Last Rate Last Dose  . acetaminophen (TYLENOL) tablet 650 mg  650 mg Oral Q6H PRN Thermon LeylandLaura A Davis, NP      . alum & mag hydroxide-simeth (MAALOX/MYLANTA) 200-200-20 MG/5ML suspension 30 mL  30 mL Oral Q4H PRN Thermon LeylandLaura A Davis, NP      . cholecalciferol (VITAMIN D) tablet 1,000 Units  1,000 Units Oral Daily Craige CottaFernando A Cobos, MD   1,000 Units at 05/26/16 0846  .  LORazepam (ATIVAN) tablet 1 mg  1 mg Oral Q6H PRN Jomarie Longs, MD       Or  . LORazepam (ATIVAN) injection 1 mg  1 mg Intramuscular Q6H PRN Saramma Eappen, MD      . magnesium hydroxide (MILK OF MAGNESIA) suspension 30 mL  30 mL Oral Daily PRN Thermon Leyland, NP      . OLANZapine zydis (ZYPREXA) disintegrating tablet 5 mg  5 mg Oral QHS Craige Cotta, MD   5 mg at 05/25/16 2100  . OLANZapine zydis (ZYPREXA) disintegrating tablet 5 mg  5 mg Oral Q12H PRN Rockey Situ Cobos, MD      .  ramelteon (ROZEREM) tablet 8 mg  8 mg Oral QHS Thermon Leyland, NP   8 mg at 05/25/16 2101  . sertraline (ZOLOFT) tablet 100 mg  100 mg Oral Daily Sanjuana Kava, NP   100 mg at 05/26/16 1610    Lab Results:  No results found for this or any previous visit (from the past 48 hour(s)). Blood Alcohol level:  Lab Results  Component Value Date   ETH <5 05/19/2016   ETH <5 05/17/2016   Physical Findings: AIMS: Facial and Oral Movements Muscles of Facial Expression: None, normal Lips and Perioral Area: None, normal Jaw: None, normal Tongue: None, normal,Extremity Movements Upper (arms, wrists, hands, fingers): None, normal Lower (legs, knees, ankles, toes): None, normal, Trunk Movements Neck, shoulders, hips: None, normal, Overall Severity Severity of abnormal movements (highest score from questions above): None, normal Incapacitation due to abnormal movements: None, normal Patient's awareness of abnormal movements (rate only patient's report): No Awareness, Dental Status Current problems with teeth and/or dentures?: No Does patient usually wear dentures?: No  CIWA:    COWS:     Musculoskeletal: Strength & Muscle Tone: within normal limits Gait & Station: normal Patient leans: N/A  Psychiatric Specialty Exam: Physical Exam  Nursing note and vitals reviewed. Constitutional: He is oriented to person, place, and time. He appears well-developed.  HENT:  Head: Normocephalic.  Cardiovascular: Normal rate.   Musculoskeletal: Normal range of motion.  Neurological: He is alert and oriented to person, place, and time.  Psychiatric: He has a normal mood and affect. His behavior is normal.    Review of Systems  Psychiatric/Behavioral: Positive for depression. Negative for suicidal ideas. The patient is nervous/anxious.   All other systems reviewed and are negative.   Blood pressure 103/68, pulse 90, temperature 97.9 F (36.6 C), temperature source Oral, resp. rate 18, height 5\' 11"  (1.803  m), weight 69.4 kg (153 lb), SpO2 100 %.Body mass index is 21.35 kg/(m^2).  General Appearance: Guarded, slow, dull.  Eye Contact: Fair  Speech:Slow, clear, not spontaneous.  Volume: Decreased  Mood: Depressed, flat affect  Affect: Depressed, flat  Thought Process: Goal Directed and Descriptions of Associations: Intact  Orientation: Full (Time, Place, and Person)  Thought Content: Delusions, Paranoid Ideation and Rumination  Suicidal Thoughts: Denies  Homicidal Thoughts: Denies  Memory: Immediate; Fair Recent; Fair Remote; Fair  Judgement:Fair  Insight: Shallow  Psychomotor Activity: Restlessness   Concentration: Concentration: Fair and Attention Span: Fair  Recall: [employer] of Knowledge: Fair  Language: Fair  Akathisia: No  Handed: Right  AIMS (if indicated):    Assets: Communication Skills  ADL's: Intact  Cognition: WNL        Sleep:  Number of Hours: 6.75     I agree with current treatment plan on 05/26/2016, Patient seen face-to-face for psychiatric evaluation  follow-up, chart reviewed. Reviewed the information documented and agree with the treatment plan.  Treatment Plan Summary:  Of Note: Pleases see family session note.  Daily contact with patient to assess and evaluate symptoms and progress in treatment  1. Continue crisis management & mood stabilization, patient continue to present very depressed. 2. Continue current medication management to reduce current symptoms to base line and improve the  patient's overall level of functioning;  continue Sertraline 100 mg for depression, Continue the Olanzapine disintegration tablet 5 mg prn for mood control, Rozerem 8 mg for insomnia, Lorazepam 1 mg for severe anxiety. 3. Treat health problems as indicated; 4. Develop treatment plan to enhance medication adeherance upon discharge & prevent the need for  readmission. 5. Psycho-social education regarding mental health  mainatenance and self care. 6. Will continue PRN treatment regimen per protocols. 7.Monitor vital signs, review pertinent findings or order labs when necessary. 8. Social Worker to work on discharge disposition, family to meet with Dr. Jama Flavors today. Labs- Vitamin D is low . Plan- as discussed with family, patient  D/C Seroquel  Continue Vitamin D supplementation  Consider inpatient ECT referral if no significant, prompt improvement with current medication regimen.   Oneta Rack, NP 05/26/2016, 2:19 PM I agreed with findings and treatment plan of this patient

## 2016-05-26 NOTE — Progress Notes (Signed)
Bruce [name] rates Anxiety 1/10 and Depression 0/10. He did not have a goal for today. His affect is flat. He is minimally interactive with peers and staff. Only responds to yes or no questions. Denies SI/HI/AVH. Contracts for safety. Encouragement and support given. Medications administered as prescribed. Continue to monitor for patient safety and medication effectiveness.

## 2016-05-26 NOTE — Progress Notes (Signed)
DAR NOTE: Patient presents with depressed mood and affect.  Denies pain, auditory and visual hallucinations.  Rates depression at 1, hopelessness at 1, and anxiety at 0.  Described energy level as normal and concentration as good.  Maintained on routine safety checks.  Medications given as prescribed.  Support and encouragement offered as needed.  Attended group and participated.  States goal for today is "focus on goals."  Patient remained isolative to his room most of this shift.  Patient encouraged to be visible in milieu.

## 2016-05-26 NOTE — [facility] Group Notes (Signed)
[facility] Group Notes:  (Nursing/MHT/Case Management/Adjunct)  Date:  05/26/2016  Time:  5:53 PM  Type of Therapy:  Nurse Education  Participation Level:  Active  Participation Quality:  Appropriate and Attentive  Affect:  Appropriate and Depressed  Cognitive:  Alert and Appropriate  Insight:  Appropriate and Good  Engagement in Group:  None  Modes of Intervention:  Discussion and Education  Summary of Progress/Problems: Topic was healthy coping skills.  Discussed the importance of learning healthy coping skills and encouraged group to use the power of positive thinking and attitude to their benefit.  Patient was receptive and attentive throughout.    Mickie Baillizabeth O Iwenekha 05/26/2016, 5:53 PM

## 2016-05-26 NOTE — [facility] Group Notes (Signed)
[facility] LCSW Group Therapy Note  05/26/2016 10:00 AM  Type of Therapy and Topic:  Group Therapy: Avoiding Self-Sabotaging and Enabling Behaviors  Participation Level:  Minimal  Participation Quality:  Inattentive and Resistant  Affect:  Depressed and Flat  Cognitive:  Oriented  Insight:  Developing/Improving  Engagement in Therapy:  Developing/Improving   Therapeutic models used Cognitive Behavioral Therapy Person-Centered Therapy Motivational Interviewing   Summary of Patient Progress: The main focus of today's process group was to explain to the adolescent what "self-sabotage" means and use Motivational Interviewing to discuss what benefits, negative or positive, were involved in a self-identified self-sabotaging behavior. Patient had difficulty engaging in discussion as evidenced by his lack of eye contact and tendency to [facility] from group with eyes and body posture. Patient shared that fall is his favorite time of year and basically nothing else.  Bruce [name] Name, LCSW

## 2016-05-27 NOTE — [facility] Group Notes (Signed)
[facility] LCSW Group Therapy  05/27/2016 10:30 AM  Type of Therapy:  Group Therapy  Participation Level:  None  Participation Quality:  Attentive  Affect:  Flat  Cognitive:  Appropriate  Insight:  None  Engagement in Therapy:  None  Modes of Intervention:  Discussion  Summary of Progress/Problems: Group today was about identifying happiness/goals. Discussed with group plans for happiness. They were given the opportunity to talk about what made them happy. They shared things like family, health and money. They were able to discuss obstacles to happiness. As well as identify supports and creating plans to full happiness or other goals. Patient did not contribute during group.   Beverly SessionsLINDSEY, Garison Genova J 05/27/2016, 11:47 AM

## 2016-05-27 NOTE — Progress Notes (Signed)
Child/Adolescent Psychoeducational Group Note  Date:  05/27/2016 Time:  9:13 PM  Group Topic/Focus:  Wrap-Up Group:   The focus of this group is to help patients review their daily goal of treatment and discuss progress on daily workbooks.  Participation Level:  Did Not Attend  Additional Comments:  Pt was encouraged to attend group, but did not.  Burman FreestoneCraddock, Xavian Hardcastle L 05/27/2016, 9:13 PM

## 2016-05-27 NOTE — Progress Notes (Signed)
DAR NOTE: Patient mood and affect remained depressed.  Denies pain, auditory and visual hallucinations.  Rates depression at 0, hopelessness at 0, and anxiety at 0.  Described energy level as normal and concentration as good.  Maintained on routine safety checks.  Medications given as prescribed.  Support and encouragement offered as needed.  Attended group and participated.  States goal for today is "maintain progress."  Patient remained isolative to his room.

## 2016-05-27 NOTE — Progress Notes (Signed)
[facility] MD Progress Note  05/27/2016 1:38 PM Bruce [name]  MRN:  [id number]  Subjective: Patient reports "I am fine and a little anxiety today"  Objective: Bruce [name] is awake, alert and oriented X4. Seen resting in bedroom. Patient appears flat and guarded with minimal ey contact. Denies suicidal or homicidal ideation. Denies auditory or visual hallucination and does not appear to be responding to internal stimuli. Patient reports he is medication compliant without mediation side effects. Patient reports history of depression reports his depression 3/10. Reports "I am just thinking a lot." States is anxiety is normally about 5-6/10 most days however is feeling better this morning. Reports good appetite and resting well. Patient report she has a good visit with his wife on yesterday. Support, encouragement and reassurance was provided.   Principal Problem: MDD (major depressive disorder), recurrent, severe, with psychosis (HCC)  Diagnosis:   Patient Active Problem List   Diagnosis Date Noted  . MDD (major depressive disorder), recurrent, severe, with psychosis (HCC) [F33.3] 05/21/2016  . Low testosterone [E29.1] 05/18/2016  . Depression [F32.9] 04/03/2016  . Constipation [K59.00] 03/22/2016  . Insomnia [G47.00] 03/12/2016  . 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 Hypogonadism, male [E29.1] 02/17/2016  . Fatigue [R53.83] 11/28/2015  . Erectile dysfunction [N52.9] 11/28/2015  . Decreased libido [R68.82] 11/28/2015  . Snoring [R06.83] 11/28/2015  . Generalized anxiety disorder [F41.1] 11/28/2015  . Hypercholesteremia [E78.00]   . Vitamin D deficiency [E55.9]    Total Time spent with patient: 30 minutes  Past Psychiatric History: Major depression  Past Medical History:  Past Medical History  Diagnosis Date  . Hypercholesteremia   . Vitamin D deficiency   . Depression 04/03/2016    Past Surgical History  Procedure Laterality Date  . Tonsillectomy     Family History:   Family History  Problem Relation Age of Onset  . Heart attack Father   . Hypothyroidism Mother   . Other Neg Hx     hypogonadism   Family Psychiatric  History: See H&P  Social History:  History  Alcohol Use No     History  Drug Use No    Social History   Social History  . Marital Status: Married    Spouse Name: N/A  . Number of Children: 4  . Years of Education: N/A   Occupational History  . engineer    Social History Main Topics  . Smoking status: Never Smoker   . Smokeless tobacco: None  . Alcohol Use: No  . Drug Use: No  . Sexual Activity: Yes   Other Topics Concern  . None   Social History Narrative   Additional Social History:    Pain Medications: pt denies abuse - see pta meds list Prescriptions: pt denies abuse - see pta med  Over the Counter: SEE MAR History of alcohol / drug use?: No history of alcohol / drug abuse Longest period of sobriety (when/how long): NA  Sleep: Good  Appetite:  Good  Current Medications: Current Facility-Administered Medications  Medication Dose Route Frequency Provider Last Rate Last Dose  . acetaminophen (TYLENOL) tablet 650 mg  650 mg Oral Q6H PRN Thermon Leyland, NP      . alum & mag hydroxide-simeth (MAALOX/MYLANTA) 200-200-20 MG/5ML suspension 30 mL  30 mL Oral Q4H PRN Thermon Leyland, NP      . cholecalciferol (VITAMIN D) tablet 1,000 Units  1,000 Units Oral Daily Craige Cotta, MD   1,000 Units at 05/27/16 0817  . LORazepam (ATIVAN)  tablet 1 mg  1 mg Oral Q6H PRN Jomarie LongsSaramma Eappen, MD       Or  . LORazepam (ATIVAN) injection 1 mg  1 mg Intramuscular Q6H PRN Saramma Eappen, MD      . magnesium hydroxide (MILK OF MAGNESIA) suspension 30 mL  30 mL Oral Daily PRN Thermon LeylandLaura A Davis, NP      . OLANZapine zydis (ZYPREXA) disintegrating tablet 5 mg  5 mg Oral QHS Craige CottaFernando A Cobos, MD   5 mg at 05/26/16 2148  . OLANZapine zydis (ZYPREXA) disintegrating tablet 5 mg  5 mg Oral Q12H PRN Rockey SituFernando A Cobos, MD      . ramelteon  (ROZEREM) tablet 8 mg  8 mg Oral QHS Thermon LeylandLaura A Davis, NP   8 mg at 05/26/16 2148  . sertraline (ZOLOFT) tablet 100 mg  100 mg Oral Daily Sanjuana KavaAgnes I Nwoko, NP   100 mg at 05/27/16 16100817    Lab Results:  No results found for this or any previous visit (from the past 48 hour(s)). Blood Alcohol level:  Lab Results  Component Value Date   ETH <5 05/19/2016   ETH <5 05/17/2016   Physical Findings: AIMS: Facial and Oral Movements Muscles of Facial Expression: None, normal Lips and Perioral Area: None, normal Jaw: None, normal Tongue: None, normal,Extremity Movements Upper (arms, wrists, hands, fingers): None, normal Lower (legs, knees, ankles, toes): None, normal, Trunk Movements Neck, shoulders, hips: None, normal, Overall Severity Severity of abnormal movements (highest score from questions above): None, normal Incapacitation due to abnormal movements: None, normal Patient's awareness of abnormal movements (rate only patient's report): No Awareness, Dental Status Current problems with teeth and/or dentures?: No Does patient usually wear dentures?: No  CIWA:    COWS:     Musculoskeletal: Strength & Muscle Tone: within normal limits Gait & Station: normal Patient leans: N/A  Psychiatric Specialty Exam: Physical Exam  Nursing note and vitals reviewed. Constitutional: He is oriented to person, place, and time. He appears well-developed.  HENT:  Head: Normocephalic.  Cardiovascular: Normal rate.   Musculoskeletal: Normal range of motion.  Neurological: He is alert and oriented to person, place, and time.  Psychiatric: He has a normal mood and affect. His behavior is normal.    Review of Systems  Psychiatric/Behavioral: Positive for depression. Negative for suicidal ideas. The patient is nervous/anxious.   All other systems reviewed and are negative.   Blood pressure 96/65, pulse 88, temperature 97.9 F (36.6 C), temperature source Oral, resp. rate 16, height 5\' 11"  (1.803 m), weight  69.4 kg (153 lb), SpO2 100 %.Body mass index is 21.35 kg/(m^2).  General Appearance: Guarded, slow, dull.  Eye Contact: Fair  Speech:Slow, clear, not spontaneous.  Volume: Decreased  Mood: Depressed, flat affect  Affect: Depressed, flat  Thought Process: Goal Directed and Descriptions of Associations: Intact  Orientation: Full (Time, Place, and Person)  Thought Content: Delusions, Paranoid Ideation and Rumination  Suicidal Thoughts: Denies  Homicidal Thoughts: Denies  Memory: Immediate; Fair Recent; Fair Remote; Fair  Judgement:Fair  Insight: Shallow  Psychomotor Activity: Restlessness   Concentration: Concentration: Fair and Attention Span: Fair  Recall: Fis[employer]Fair  Fund of Knowledge: Fair  Language: Fair  Akathisia: No  Handed: Right  AIMS (if indicated):    Assets: Communication Skills  ADL's: Intact  Cognition: WNL        Sleep:  Number of Hours: 6.75     I agree with current treatment plan on 05/27/2016, Patient seen face-to-face for psychiatric evaluation follow-up, chart  reviewed. Reviewed the information documented and agree with the treatment plan.  Treatment Plan Summary:  Of Note: Pleases see family session note.  Daily contact with patient to assess and evaluate symptoms and progress in treatment  1. Continue crisis management & mood stabilization, patient continue to present very depressed.- little improvement noted 2. Continue current medication management to reduce current symptoms to base line and improve the  patient's overall level of functioning;  continue Sertraline 100 mg for depression, Continue the Olanzapine disintegration tablet 5 mg prn for mood control, Rozerem 8 mg for insomnia, Lorazepam 1 mg for severe anxiety. Treat health problems as indicated: 4. Develop treatment plan to enhance medication adeherance upon discharge & prevent the need for  readmission. 5. Psycho-social education regarding mental  health maintenance and self care. 6. Will continue PRN treatment regimen per protocols. 7.Monitor vital signs, review pertinent findings or order labs when necessary. 8. Social Worker to work on discharge disposition, family to meet with Dr. Jama Flavors today. Labs- Vitamin D is low . Plan- as discussed with family, patient  D/C Seroquel  Continue Vitamin D supplementation  Consider inpatient ECT referral if no significant, prompt improvement with current medication regimen.   Oneta Rack, NP 05/27/2016, 1:38 PM I agreed with findings and treatment plan of this patient

## 2016-05-28 MED ORDER — VITAMIN B-12 100 MCG PO TABS
100.0000 ug | ORAL_TABLET | Freq: Every day | ORAL | Status: DC
  Administered 2016-05-28 – 2016-05-30 (×3): 100 ug via ORAL
  Filled 2016-05-28 (×4): qty 1

## 2016-05-28 NOTE — Tx Team (Signed)
Interdisciplinary Treatment Plan Update (Adult)  Date:  05/28/2016   Time Reviewed:  8:27 AM   Progress in Treatment: Attending groups: Yes. Participating in groups:  Yes. Taking medication as prescribed:  Yes. Tolerating medication:  Yes. Family/Significant other contact made:  Yes Patient understands diagnosis:  Yes  As evidenced by seeking help with "irrational thoughts" Discussing patient identified problems/goals with staff:  Yes, see initial care plan. Medical problems stabilized or resolved:  Yes. Denies suicidal/homicidal ideation: Yes. Issues/concerns per patient self-inventory:  No. Other:  New problem(s) identified:  Discharge Plan or Barriers: see below  Reason for Continuation of Hospitalization: Delusions  Depression Medication stabilization  Comments:  Patient is a 55 year old male past medical history of depression who presents to the ED with his wife with complaints of "irrational thoughts". Patient reports over the past few days he has felt more anxious and has been having difficulty sleeping. He reports having a fear that his wife is going to leave him.  Pt will be started on Zoloft 25 mg po daily for depressive sx. Will add Seroquel 25 mg po qhs for sleep as well as psychosis. Will continue Rozerem 8 mg po qhs for sleep. Rozerem takes a few days to have its full effect.   05/28/16:  Pt with depressive sx , currently on zoloft and zyprexa, making slow progress. Pt continues to need encouragement to take care of his ADLs - will continue to encourage and support. Reviewed family session notes per Dr.Cobbos - will continuetreatment. Will continue Sertraline 100 mg for depression. Will continue the Olanzapine disintegration tablet 5 mg Po qhs for mood sx/psychosis . Will continue Rozerem 8 mg po qhs for sleep. Will continue PRN medications as per agitation protocol. Will continue Vitamin D supplementation as well Vitamin b12 replacement . As per Dr.Cobbos notes -  Consider inpatient ECT referral if no significant, prompt improvement with current medication regimen.   Estimated length of stay: 1-3 days days  New goal(s):  Review of initial/current patient goals per problem list:   Review of initial/current patient goals per problem list:  1. Goal(s): Patient will participate in aftercare plan   Met: Yes   Target date: 3-5 days post admission date   As evidenced by: Patient will participate within aftercare plan AEB aftercare provider and housing plan at discharge being identified. 05/23/16:  Return home, follow up outpt   2. Goal (s): Patient will exhibit decreased depressive symptoms and suicidal ideations.   Met: Progressing   Target date: 3-5 days post admission date   As evidenced by: Patient will utilize self rating of depression at 3 or below and demonstrate decreased signs of depression or be deemed stable for discharge by MD. 05/23/16:  Although he rates his depression at less than a 3, he presents with flat affect, grim, lack of energy.  Appears profoundly depressed. 05/28/16:  Brighter affect-still not bathing-still spending time in his bed if not programming      5. Goal(s): Patient will demonstrate decreased signs of psychosis  * Met: Yes  * Target date: 3-5 days post admission date  * As evidenced by: Patient will demonstrate decreased frequency of AVH or return to baseline function 05/23/16:  Presents with delusions 05/28/16:  No longer endorsing delusions          Attendees: Patient:  05/28/2016 8:27 AM   Family:   05/28/2016 8:27 AM   Physician:  Ursula Alert, MD 05/28/2016 8:27 AM   Nursing:   Jeanie Cooks, RN 05/28/2016  8:27 AM   CSW:    Roque Lias, [place]   05/28/2016 8:27 AM   Other:  05/28/2016 8:27 AM   Other:   05/28/2016 8:27 AM   Other:  Lars Pinks, Nurse CM 05/28/2016 8:27 AM   Other:   05/28/2016 8:27 AM   Other:  Norberto Sorenson, [place]  05/28/2016 8:27 AM   Other:  05/28/2016 8:27 AM   Other:   05/28/2016 8:27 AM   Other:  05/28/2016 8:27 AM   Other:  05/28/2016 8:27 AM   Other:  05/28/2016 8:27 AM   Other:   05/28/2016 8:27 AM    Scribe for Treatment Team:   Trish Mage, 05/28/2016 8:27 AM

## 2016-05-28 NOTE — Progress Notes (Signed)
D:Patient in the hallway on approach.  Patient states he is ready to go home and is sad to see his wofe leave for visitation.  Patient appears sad and depressed. Patient rates his overall day 7/10.  Patient did smile some when speaking to Clinical research associatewriter. Patient denies SI/HI and denies AVH. A: Staff to monitor Q 15 mins for safety.  Encouragement and support offered.  Scheduled medications administered per orders. R: Patient remains safe on the unit.  Patient did not attendgroup tonight.  Patient briefly visible on the unit tonight but has been isolating in his room.  Patient taking administered medications.

## 2016-05-28 NOTE — Progress Notes (Signed)
Pt present with depressed mood today, pt has been in the bed most of the time. Pt wife called and was concerned about pt not taking shower, pt has been encouraged to shower, pt stated " I will shower when I am ready."  . As per self inventory, pt had a good night sleep, good appetite, normal energy, and good concentration. Pt rate depression at 1, hopeless ness at 0, and anxiety at 1. Pt's safety ensured with 15 minute and environmental checks. Pt currently denies SI/HI and A/V hallucinations. Pt verbally agrees to seek staff if SI/HI or A/VH occurs and to consult with staff before acting on these thoughts. Will continue POC.

## 2016-05-28 NOTE — Progress Notes (Signed)
[facility]  05/28/2016 1:10 PM Bruce PoissonZane Vanwey  MRN:  [id number]  Subjective: Patient reports "I am better."   Objective: Bruce [name] is awake, alert and oriented X4.  Husein was seen in bed , his eye contact was good and his affect seems more reactive . Pt reports depression as improving. Pt denied any new concerns . Reports paranoia is improved . Per staff , pt has not been taking showers and his family is concerned about the same. He has been taking his meals and medications with encouragement. When the importance of taking a shower was discussed with pt - he nodded his head stating "he will ". Will continue to encourage pt and observe on the unit.   Principal Problem: MDD (major depressive disorder), recurrent, severe, with psychosis (HCC)  Diagnosis:   Patient Active Problem List   Diagnosis Date Noted  . MDD (major depressive disorder), recurrent, severe, with psychosis (HCC) [F33.3] 05/21/2016  . Low testosterone [E29.1] 05/18/2016  . Depression [F32.9] 04/03/2016  . Constipation [K59.00] 03/22/2016  . Insomnia [G47.00] 03/12/2016  . 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 Hypogonadism, male [E29.1] 02/17/2016  . Fatigue [R53.83] 11/28/2015  . Erectile dysfunction [N52.9] 11/28/2015  . Decreased libido [R68.82] 11/28/2015  . Snoring [R06.83] 11/28/2015  . Generalized anxiety disorder [F41.1] 11/28/2015  . Hypercholesteremia [E78.00]   . Vitamin D deficiency [E55.9]    Total Time spent with patient: 30 minutes  Past Psychiatric History: Major depression  Past Medical History:  Past Medical History  Diagnosis Date  . Hypercholesteremia   . Vitamin D deficiency   . Depression 04/03/2016    Past Surgical History  Procedure Laterality Date  . Tonsillectomy     Family History:  Family History  Problem Relation Age of Onset  . Heart attack Father   . Hypothyroidism Mother   . Other Neg Hx     hypogonadism   Family Psychiatric  History: See  H&P  Social History:  History  Alcohol Use No     History  Drug Use No    Social History   Social History  . Marital Status: Married    Spouse Name: N/A  . Number of Children: 4  . Years of Education: N/A   Occupational History  . engineer    Social History Main Topics  . Smoking status: Never Smoker   . Smokeless tobacco: None  . Alcohol Use: No  . Drug Use: No  . Sexual Activity: Yes   Other Topics Concern  . None   Social History Narrative   Additional Social History:    Pain Medications: pt denies abuse - see pta meds list Prescriptions: pt denies abuse - see pta med  Over the Counter: SEE MAR History of alcohol / drug use?: No history of alcohol / drug abuse Longest period of sobriety (when/how long): NA  Sleep: Fair  Appetite:  Fair  Current Medications: Current Facility-Administered Medications  Medication Dose Route Frequency Provider Last Rate Last Dose  . acetaminophen (TYLENOL) tablet 650 mg  650 mg Oral Q6H PRN Thermon LeylandLaura A Davis, NP      . alum & mag hydroxide-simeth (MAALOX/MYLANTA) 200-200-20 MG/5ML suspension 30 mL  30 mL Oral Q4H PRN Thermon LeylandLaura A Davis, NP      . cholecalciferol (VITAMIN D) tablet 1,000 Units  1,000 Units Oral Daily Craige CottaFernando A Cobos, MD   1,000 Units at 05/28/16 0813  . LORazepam (ATIVAN) tablet 1 mg  1 mg Oral Q6H PRN Gael Delude  Elna Breslow, MD       Or  . LORazepam (ATIVAN) injection 1 mg  1 mg Intramuscular Q6H PRN Panayiota Larkin, MD      . magnesium hydroxide (MILK OF MAGNESIA) suspension 30 mL  30 mL Oral Daily PRN Thermon Leyland, NP      . OLANZapine zydis (ZYPREXA) disintegrating tablet 5 mg  5 mg Oral QHS Craige Cotta, MD   5 mg at 05/27/16 2239  . OLANZapine zydis (ZYPREXA) disintegrating tablet 5 mg  5 mg Oral Q12H PRN Rockey Situ Cobos, MD      . ramelteon (ROZEREM) tablet 8 mg  8 mg Oral QHS Thermon Leyland, NP   8 mg at 05/27/16 2239  . sertraline (ZOLOFT) tablet 100 mg  100 mg Oral Daily Sanjuana Kava, NP   100 mg at 05/28/16 0813   . vitamin B-12 (CYANOCOBALAMIN) tablet 100 mcg  100 mcg Oral Daily Jomarie Longs, MD   100 mcg at 05/28/16 1252    Lab Results:  No results found for this or any previous visit (from the past 48 hour(s)). Blood Alcohol level:  Lab Results  Component Value Date   ETH <5 05/19/2016   ETH <5 05/17/2016   Physical Findings: AIMS: Facial and Oral Movements Muscles of Facial Expression: None, normal Lips and Perioral Area: None, normal Jaw: None, normal Tongue: None, normal,Extremity Movements Upper (arms, wrists, hands, fingers): None, normal Lower (legs, knees, ankles, toes): None, normal, Trunk Movements Neck, shoulders, hips: None, normal, Overall Severity Severity of abnormal movements (highest score from questions above): None, normal Incapacitation due to abnormal movements: None, normal Patient's awareness of abnormal movements (rate only patient's report): No Awareness, Dental Status Current problems with teeth and/or dentures?: No Does patient usually wear dentures?: No  CIWA:    COWS:     Musculoskeletal: Strength & Muscle Tone: within normal limits Gait & Station: normal Patient leans: N/A  Psychiatric Specialty Exam: Physical Exam  Nursing note and vitals reviewed. Constitutional: He is oriented to person, place, and time. He appears well-developed.  HENT:  Head: Normocephalic.  Cardiovascular: Normal rate.   Musculoskeletal: Normal range of motion.  Neurological: He is alert and oriented to person, place, and time.  Psychiatric: He has a normal mood and affect. His behavior is normal.    Review of Systems  Psychiatric/Behavioral: Positive for depression. Negative for suicidal ideas. The patient is nervous/anxious.   All other systems reviewed and are negative.   Blood pressure 98/81, pulse 82, temperature 97.9 F (36.6 C), temperature source Oral, resp. rate 14, height 5\' 11"  (1.803 m), weight 69.4 kg (153 lb), SpO2 100 %.Body mass index is 21.35 kg/(m^2).   General Appearance: Guarded  Eye Contact: Fair  Speech:Slow, clear, not spontaneous.  Volume: Decreased  Mood: Depressed, reactive at times  Affect: Depressed, reactive at times  Thought Process: Goal Directed and Descriptions of Associations: Intact  Orientation: Full (Time, Place, and Person)  Thought Content: Delusions, Paranoid Ideation and Rumination- improving  Suicidal Thoughts: Denies  Homicidal Thoughts: Denies  Memory: Immediate; Fair Recent; Fair Remote; Fair  Judgement:Fair  Insight: Shallow  Psychomotor Activity: Restlessness   Concentration: Concentration: Fair and Attention Span: Fair  Recall: [employer] of Knowledge: Fair  Language: Fair  Akathisia: No  Handed: Right  AIMS (if indicated):    Assets: Communication Skills  ADL's: not taking showers - staff and family encouraging pt to do so  Cognition: WNL  Sleep:  Number of Hours: 6       Treatment Plan Summary: Pt with depressive sx , currently on zoloft and zyprexa, making slow progress. Pt continues to need encouragement to take care of his ADLs - will continue to encourage and support. Reviewed family session notes per Dr.Cobbos - will continuetreatment. Daily contact with patient to assess and evaluate symptoms and progress in treatment  Will continue Sertraline 100 mg for depression. Will continue the Olanzapine disintegration tablet 5 mg Po qhs for mood sx/psychosis . Will continue Rozerem 8 mg po qhs for sleep. Will continue PRN medications as per agitation protocol. Will continue Vitamin D supplementation as well Vitamin b12 replacement . As per Dr.Cobbos notes - Consider inpatient ECT referral if no significant, prompt improvement with current medication regimen.  Will get recreational therapy consult. CSW will work on disposition.  Ashyr Hedgepath, MD 05/28/2016, 1:10 PM

## 2016-05-28 NOTE — Progress Notes (Addendum)
Pt is moved back to [address] unit, no problem encounter. Pt tolerated well, will keep on monitoring. 

## 2016-05-28 NOTE — [facility] Group Notes (Signed)
[facility] LCSW Group Therapy  05/28/2016 1:15 pm  Type of Therapy: Process Group Therapy  Participation Level:  Active  Participation Quality:  Appropriate  Affect:  Flat  Cognitive:  Oriented  Insight:  Improving  Engagement in Group:  Limited  Engagement in Therapy:  Limited  Modes of Intervention:  Activity, Clarification, Education, Problem-solving and Support  Summary of Progress/Problems: Today's group addressed the issue of overcoming obstacles.  Patients were asked to identify their biggest obstacle post d/c that stands in the way of their on-going success, and then problem solve as to how to manage this. Stayed the entire time, engaged throughout.  Stated his biggest obstacle is first getting out of the hospital-"but I am doing everything I am being asked to, and I do feel better now than when I first came in."  His next obstacle is getting back to the point that he can work.  I talked to him about his condition being a marathon and not a spring, and it would require pacing and patience.  Verbalized that he understood.  He is open to attending IOP if it means he gets out of the hospital sooner.    Daryel Geraldorth, Keyia Moretto B 05/28/2016   3:45 PM

## 2016-05-28 NOTE — Progress Notes (Signed)
D. Pt had been in room and in bed for majority of shift. Pt chose not to attend evening group activity. Pt did have visit from family and did receive some phone calls which he took. Pt has appeared preoccupied and guarded this evening and did not interact with staff or peers. Pt was able to receive all medications without incident and did not verbalize any complaints of pain. A. Support and encouragement provided. R. Safety maintained, will continue to monitor.

## 2016-05-28 NOTE — Progress Notes (Signed)
Recreation Therapy Notes   06.19.2017 approximately 3:25pm. Per MD order LRT met with patient to investigate ways to enhance tx during admission. 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 Patient identified no coping skills, stating "I don't really have any." Patient expressed interest in being outside, reading and with family as leisure interest. Patient reports a desire to "feel better" as a result of tx.   Due to patient not having coping skills for anxiety LRT introduced deep breathing and mindful breathing to patient. Patient provided instruction and education on both techniques and practiced techniques with LRT. Patient practiced techniques for approximately 40 seconds independently. Patient reported no success with deep breathing, but did report that he felt more relaxed following participating in mindful breathing.  LRT will continue to introduce stress management techniques during patient admission.   Laureen Ochs Kacie Huxtable, LRT/CTRS   Lane Hacker 05/28/2016 3:47 PM

## 2016-05-29 NOTE — Progress Notes (Signed)
DAR NOTE: Patient presents with depressed mood and affect.  Denies pain, auditory and visual hallucinations.  Rates depression at 1, hopelessness at 1, and anxiety at 1.  Described energy level as normal and concentration as good.  Maintained on routine safety checks.  Medications given as prescribed.  Support and encouragement offered as needed.  Attended group and participated.  States goal for today is "maintain program."  Patient remained in his room most of this shift.  No interaction with staff or peers.  Offered no complaint.

## 2016-05-29 NOTE — Progress Notes (Signed)
D:Patient in his room in bed on approach.  Patient was more talkative with Clinical research associatewriter today.  Patient did brighten while speaking to Clinical research associatewriter.  Patient states he is excited that he may be able to discharge tomorrow.  Patient denies SI/HI and denies AVH. A: Staff to monitor Q 15 mins for safety.  Encouragement and support offered.  Scheduled medications administered per orders. R: Patient remains safe on the unit.  Patient did not attend group tonight.  Patient minimally visible on the unit and not interacting with peers.  Patient taking administered medications.

## 2016-05-29 NOTE — Progress Notes (Signed)
Patient ID: Bruce [name], male   DOB: Apr 14, 1961, 55 y.o.   MRN: [id number]  Adult Psychoeducational Group Note  Date:  05/29/2016 Time: 09:30am   Group Topic/Focus:  Recovery Goals:   The focus of this group is to identify appropriate goals for recovery and establish a plan to achieve them.  Participation Level:  Minimal  Participation Quality:  Appropriate  Affect:  Flat  Cognitive:  Alert and Oriented  Insight: Improving  Engagement in Group:  Limited  Modes of Intervention:  Discussion, Education, Orientation and Support  Additional Comments:  Pt participated minimally in group today.  Aurora Maskwyman, Escarlet Saathoff E 05/29/2016, 10:26 AM

## 2016-05-29 NOTE — Progress Notes (Signed)
Recreation Therapy Notes  Animal-Assisted Activity (AAA) Program Checklist/Progress Notes Patient Eligibility Criteria Checklist & Daily Group note for Rec Tx Intervention  Date: 06.20.2017 Time: 2:45pm [facility]    AAA/T Program Assumption of Risk Form signed by Patient/ or Parent Legal Guardian NO  Behavioral Response: Did not attend. Patient discussed with MD for appropriateness in pet therapy session. Both LRT and MD agree patient is appropriate for participation. Patient offered attendance to session, patient initially stated he would like to participate, however upon being asked to sign consent form, rescinded verbal consent and stated he would just watch through the windows of the doors to 500 hall.   LRT verified patient has been using breathing techniques introduced in previous 1:1. Patient shared he used the mindful breathing last night to help him relax before sleep and found it effective. LRT encouraged patient to continue to practice during admission.   Bruce [name], Bruce [name]         Jearl KlinefelterBlanchfield, [name] L 05/29/2016 3:45 PM

## 2016-05-29 NOTE — Progress Notes (Signed)
[facility] MD Progress Note  05/29/2016 12:51 PM Bruce [name]  MRN:  [id number]  Subjective: Patient reports "I am ok."   Objective: Bruce [name] is awake, alert and oriented X4.  Bruce [name] reports his depression as improving . Per staff he continues to have trouble taking a shower , however , has been taking his meals as well as medications. Pt is very soft spoken , mostly isolative , however does attend groups . He does not seem to be preoccupied with his delusions anymore.  Will continue to encourage pt and observe on the unit.   Principal Problem: MDD (major depressive disorder), recurrent, severe, with psychosis (HCC)  Diagnosis:   Patient Active Problem List   Diagnosis Date Noted  . MDD (major depressive disorder), recurrent, severe, with psychosis (HCC) [F33.3] 05/21/2016  . Low testosterone [E29.1] 05/18/2016  . Depression [F32.9] 04/03/2016  . Constipation [K59.00] 03/22/2016  . Insomnia [G47.00] 03/12/2016  . 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 Hypogonadism, male [E29.1] 02/17/2016  . Fatigue [R53.83] 11/28/2015  . Erectile dysfunction [N52.9] 11/28/2015  . Decreased libido [R68.82] 11/28/2015  . Snoring [R06.83] 11/28/2015  . Generalized anxiety disorder [F41.1] 11/28/2015  . Hypercholesteremia [E78.00]   . Vitamin D deficiency [E55.9]    Total Time spent with patient: 25 minutes  Past Psychiatric History: Major depression  Past Medical History:  Past Medical History  Diagnosis Date  . Hypercholesteremia   . Vitamin D deficiency   . Depression 04/03/2016    Past Surgical History  Procedure Laterality Date  . Tonsillectomy     Family History:  Family History  Problem Relation Age of Onset  . Heart attack Father   . Hypothyroidism Mother   . Other Neg Hx     hypogonadism   Family Psychiatric  History: See H&P  Social History:  History  Alcohol Use No     History  Drug Use No    Social History   Social History  . Marital Status:  Married    Spouse Name: N/A  . Number of Children: 4  . Years of Education: N/A   Occupational History  . engineer    Social History Main Topics  . Smoking status: Never Smoker   . Smokeless tobacco: None  . Alcohol Use: No  . Drug Use: No  . Sexual Activity: Yes   Other Topics Concern  . None   Social History Narrative   Additional Social History:    Pain Medications: pt denies abuse - see pta meds list Prescriptions: pt denies abuse - see pta med  Over the Counter: SEE MAR History of alcohol / drug use?: No history of alcohol / drug abuse Longest period of sobriety (when/how long): NA  Sleep: Fair  Appetite:  Fair  Current Medications: Current Facility-Administered Medications  Medication Dose Route Frequency Provider Last Rate Last Dose  . acetaminophen (TYLENOL) tablet 650 mg  650 mg Oral Q6H PRN Thermon Leyland, NP      . alum & mag hydroxide-simeth (MAALOX/MYLANTA) 200-200-20 MG/5ML suspension 30 mL  30 mL Oral Q4H PRN Thermon Leyland, NP      . cholecalciferol (VITAMIN D) tablet 1,000 Units  1,000 Units Oral Daily Craige Cotta, MD   1,000 Units at 05/29/16 0825  . LORazepam (ATIVAN) tablet 1 mg  1 mg Oral Q6H PRN Jomarie Longs, MD       Or  . LORazepam (ATIVAN) injection 1 mg  1 mg Intramuscular Q6H PRN Jomarie Longs, MD      .  magnesium hydroxide (MILK OF MAGNESIA) suspension 30 mL  30 mL Oral Daily PRN Thermon Leyland, NP      . OLANZapine zydis (ZYPREXA) disintegrating tablet 5 mg  5 mg Oral QHS Craige Cotta, MD   5 mg at 05/28/16 2226  . OLANZapine zydis (ZYPREXA) disintegrating tablet 5 mg  5 mg Oral Q12H PRN Rockey Situ Cobos, MD      . ramelteon (ROZEREM) tablet 8 mg  8 mg Oral QHS Thermon Leyland, NP   8 mg at 05/28/16 2226  . sertraline (ZOLOFT) tablet 100 mg  100 mg Oral Daily Sanjuana Kava, NP   100 mg at 05/29/16 0824  . vitamin B-12 (CYANOCOBALAMIN) tablet 100 mcg  100 mcg Oral Daily Jomarie Longs, MD   100 mcg at 05/29/16 1610    Lab Results:   No results found for this or any previous visit (from the past 48 hour(s)). Blood Alcohol level:  Lab Results  Component Value Date   ETH <5 05/19/2016   ETH <5 05/17/2016   Physical Findings: AIMS: Facial and Oral Movements Muscles of Facial Expression: None, normal Lips and Perioral Area: None, normal Jaw: None, normal Tongue: None, normal,Extremity Movements Upper (arms, wrists, hands, fingers): None, normal Lower (legs, knees, ankles, toes): None, normal, Trunk Movements Neck, shoulders, hips: None, normal, Overall Severity Severity of abnormal movements (highest score from questions above): None, normal Incapacitation due to abnormal movements: None, normal Patient's awareness of abnormal movements (rate only patient's report): No Awareness, Dental Status Current problems with teeth and/or dentures?: No Does patient usually wear dentures?: No  CIWA:    COWS:     Musculoskeletal: Strength & Muscle Tone: within normal limits Gait & Station: normal Patient leans: N/A  Psychiatric Specialty Exam: Physical Exam  Nursing note and vitals reviewed. Constitutional: He is oriented to person, place, and time. He appears well-developed.  HENT:  Head: Normocephalic.  Cardiovascular: Normal rate.   Musculoskeletal: Normal range of motion.  Neurological: He is alert and oriented to person, place, and time.  Psychiatric: He has a normal mood and affect. His behavior is normal.    Review of Systems  Psychiatric/Behavioral: Positive for depression. Negative for suicidal ideas. The patient is nervous/anxious.   All other systems reviewed and are negative.   Blood pressure 93/67, pulse 96, temperature 97.6 F (36.4 C), temperature source Oral, resp. rate 18, height  (1.803 m), weight 69.4 kg (153 lb), SpO2 100 %.Body mass index is 21.35 kg/(m^2).  General Appearance: Guarded  Eye Contact: Fair  Speech:Slow, clear, not spontaneous.  Volume: Decreased  Mood: Depressed,  reactive at times  Affect: Depressed, reactive at times  Thought Process: Goal Directed and Descriptions of Associations: Intact  Orientation: Full (Time, Place, and Person)  Thought Content: Delusions, Paranoid Ideation and Rumination- improving  Suicidal Thoughts: Denies  Homicidal Thoughts: Denies  Memory: Immediate; Fair Recent; Fair Remote; Fair  Judgement:Fair  Insight: Shallow  Psychomotor Activity: Restlessness   Concentration: Concentration: Fair and Attention Span: Fair  Recall: [employer] of Knowledge: Fair  Language: Fair  Akathisia: No  Handed: Right  AIMS (if indicated):    Assets: Communication Skills  ADL's: not taking showers - staff and family encouraging pt to do so  Cognition: WNL        Sleep:  Number of Hours: 6.75       Treatment Plan Summary: Pt with depressive sx , currently on zoloft and zyprexa, making slow progress. Pt continues to  need encouragement to take care of his ADLs - will continue to encourage and support.Will continue treatment. Daily contact with patient to assess and evaluate symptoms and progress in treatment  Will continue Sertraline 100 mg for depression. Will continue the Olanzapine disintegration tablet 5 mg Po qhs for mood sx/psychosis . Will continue Rozerem 8 mg po qhs for sleep. Will continue PRN medications as per agitation protocol. Will continue Vitamin D supplementation as well Vitamin b12 replacement . Placed recreational therapy consult. CSW will work on disposition.Pt referred to IOP - pt is motivated to do so.  Akili Cuda, MD 05/29/2016, 12:51 PM

## 2016-05-29 NOTE — Tx Team (Signed)
Interdisciplinary Treatment Plan Update (Adult)  Date:  05/29/2016   Time Reviewed:  1:52 PM   Progress in Treatment: Attending groups: Yes. Participating in groups:  Yes. Taking medication as prescribed:  Yes. Tolerating medication:  Yes. Family/Significant other contact made:  Yes Patient understands diagnosis:  Yes  As evidenced by seeking help with "irrational thoughts" Discussing patient identified problems/goals with staff:  Yes, see initial care plan. Medical problems stabilized or resolved:  Yes. Denies suicidal/homicidal ideation: Yes. Issues/concerns per patient self-inventory:  No. Other:  New problem(s) identified:  Discharge Plan or Barriers: see below  Reason for Continuation of Hospitalization:   Comments:  Patient is a 55 year old male past medical history of depression who presents to the ED with his wife with complaints of "irrational thoughts". Patient reports over the past few days he has felt more anxious and has been having difficulty sleeping. He reports having a fear that his wife is going to leave him.  Pt will be started on Zoloft 25 mg po daily for depressive sx. Will add Seroquel 25 mg po qhs for sleep as well as psychosis. Will continue Rozerem 8 mg po qhs for sleep. Rozerem takes a few days to have its full effect.   05/28/16:  Pt with depressive sx , currently on zoloft and zyprexa, making slow progress. Pt continues to need encouragement to take care of his ADLs - will continue to encourage and support. Reviewed family session notes per Dr.Cobbos - will continuetreatment. Will continue Sertraline 100 mg for depression. Will continue the Olanzapine disintegration tablet 5 mg Po qhs for mood sx/psychosis . Will continue Rozerem 8 mg po qhs for sleep. Will continue PRN medications as per agitation protocol. Will continue Vitamin D supplementation as well Vitamin b12 replacement . As per Dr.Cobbos notes - Consider inpatient ECT referral if no  significant, prompt improvement with current medication regimen.   Estimated length of stay: Likley d/c tomorrow  New goal(s):  Review of initial/current patient goals per problem list:   Review of initial/current patient goals per problem list:  1. Goal(s): Patient will participate in aftercare plan   Met: Yes   Target date: 3-5 days post admission date   As evidenced by: Patient will participate within aftercare plan AEB aftercare provider and housing plan at discharge being identified. 05/23/16:  Return home, follow up outpt   2. Goal (s): Patient will exhibit decreased depressive symptoms and suicidal ideations.   Met:Yes   Target date: 3-5 days post admission date   As evidenced by: Patient will utilize self rating of depression at 3 or below and demonstrate decreased signs of depression or be deemed stable for discharge by MD. 05/23/16:  Although he rates his depression at less than a 3, he presents with flat affect, grim, lack of energy.  Appears profoundly depressed. 05/28/16:  Brighter affect-still not bathing-still spending time in his bed if not programming 05/29/16:  Rates his depression a 3 today      5. Goal(s): Patient will demonstrate decreased signs of psychosis  * Met: Yes  * Target date: 3-5 days post admission date  * As evidenced by: Patient will demonstrate decreased frequency of AVH or return to baseline function 05/23/16:  Presents with delusions 05/28/16:  No longer endorsing delusions          Attendees: Patient:  05/29/2016 1:52 PM   Family:   05/29/2016 1:52 PM   Physician:  Ursula Alert, MD 05/29/2016 1:52 PM   Nursing:   Benjamine Mola  Broadus John, RN 05/29/2016 1:52 PM   CSW:    Roque Lias, LCSW   05/29/2016 1:52 PM   Other:  05/29/2016 1:52 PM   Other:   05/29/2016 1:52 PM   Other:  Lars Pinks, Nurse CM 05/29/2016 1:52 PM   Other:   05/29/2016 1:52 PM   Other:  Norberto Sorenson, [place]  05/29/2016 1:52 PM   Other:  05/29/2016 1:52 PM    Other:  05/29/2016 1:52 PM   Other:  05/29/2016 1:52 PM   Other:  05/29/2016 1:52 PM   Other:  05/29/2016 1:52 PM   Other:   05/29/2016 1:52 PM    Scribe for Treatment Team:   Trish Mage, 05/29/2016 1:52 PM

## 2016-05-29 NOTE — Progress Notes (Signed)
Adult Psychoeducational Group Note  Date:  05/29/2016 Time:  8:48 PM  Group Topic/Focus:  Wrap-Up Group:   The focus of this group is to help patients review their daily goal of treatment and discuss progress on daily workbooks.  Participation Level:  Did Not Attend  Participation Quality:  Did not attend  Affect:  Did not attend  Cognitive:  Did not attend  Insight: None  Engagement in Group:  Did not attend  Modes of Intervention:  Did not attend  Additional Comments:  Patient did not attend wrap up group tonight.   Joesph Marcy L Jakeira Seeman 05/29/2016, 8:48 PM

## 2016-05-29 NOTE — [facility] Group Notes (Signed)
[facility] LCSW Group Therapy  05/29/2016 , 1:54 PM   Type of Therapy:  Group Therapy  Participation Level:  Active  Participation Quality:  Attentive  Affect:  Appropriate  Cognitive:  Alert  Insight:  Improving  Engagement in Therapy:  Engaged  Modes of Intervention:  Discussion, Exploration and Socialization  Summary of Progress/Problems: Today's group focused on the term Diagnosis.  Participants were asked to define the term, and then pronounce whether it is a negative, positive or neutral term. Stayed the entire time, engaged throughout.  Contributed when asked directly; quiet otherwise.   Daryel Geraldorth, Bruce [name] 05/29/2016 , 1:54 PM

## 2016-05-30 MED ORDER — SERTRALINE HCL 100 MG PO TABS: 100.0000 mg | ORAL_TABLET | Freq: Every day | ORAL | Status: AC

## 2016-05-30 MED ORDER — SILDENAFIL CITRATE 20 MG PO TABS: ORAL_TABLET | ORAL | Status: AC

## 2016-05-30 MED ORDER — RAMELTEON 8 MG PO TABS: 8.0000 mg | ORAL_TABLET | Freq: Every day | ORAL | Status: AC

## 2016-05-30 MED ORDER — OLANZAPINE 5 MG PO TBDP: 5.0000 mg | ORAL_TABLET | Freq: Every day | ORAL | Status: AC

## 2016-05-30 MED ORDER — CYANOCOBALAMIN 100 MCG PO TABS: 100.0000 ug | ORAL_TABLET | Freq: Every day | ORAL | Status: AC

## 2016-05-30 MED ORDER — VITAMIN D3 25 MCG (1000 UNIT) PO TABS: 1000.0000 [IU] | ORAL_TABLET | Freq: Every day | ORAL | Status: AC

## 2016-05-30 NOTE — Progress Notes (Signed)
Pt discharged home with his wife and a daughter. Pt was ambulatory, stable and appreciative at that time. All papers and prescriptions were given and valuables returned. Verbal understanding expressed. Denies SI/HI and A/VH. Pt given opportunity to express concerns and ask questions.

## 2016-05-30 NOTE — [facility] Suicide Risk Assessment (Signed)
[facility] Assessment   Principal Problem: MDD (major depressive disorder), recurrent, severe, with psychosis (HCC) acute phase improved Discharge Diagnoses:  Patient Active Problem List   Diagnosis Date Noted  . MDD (major depressive disorder), recurrent, severe, with psychosis (HCC) [F33.3] 05/21/2016  . Low testosterone [E29.1] 05/18/2016  . Depression [F32.9] 04/03/2016  . Constipation [K59.00] 03/22/2016  . Insomnia [G47.00] 03/12/2016  . 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 Hypogonadism, male [E29.1] 02/17/2016  . Fatigue [R53.83] 11/28/2015  . Erectile dysfunction [N52.9] 11/28/2015  . Decreased libido [R68.82] 11/28/2015  . Snoring [R06.83] 11/28/2015  . Generalized anxiety disorder [F41.1] 11/28/2015  . Hypercholesteremia [E78.00]   . Vitamin D deficiency [E55.9]     Total Time spent with patient: 30 minutes  Musculoskeletal: Strength & Muscle Tone: within normal limits Gait & Station: normal Patient leans: N/A  Psychiatric Specialty Exam: Review of Systems  Psychiatric/Behavioral: Negative for depression and hallucinations.  All other systems reviewed and are negative.   Blood pressure 105/75, pulse 82, temperature 97.7 F (36.5 C), temperature source Oral, resp. rate 16, height 5\' 11"  (1.803 m), weight 69.4 kg (153 lb), SpO2 100 %.Body mass index is 21.35 kg/(m^2).  General Appearance: Fairly Groomed  Patent attorneyye Contact::  Fair  Speech:  Clear and Coherent409  Volume:  Normal  Mood:  Euthymic  Affect:  Congruent  Thought Process:  Goal Directed and Descriptions of Associations: Intact  Orientation:  Full (Time, Place, and Person)  Thought Content:  WDL  Suicidal Thoughts:  No  Homicidal Thoughts:  No  Memory:  Immediate;   Fair Recent;   Fair Remote;   Fair  Judgement:  Fair  Insight:  Fair  Psychomotor Activity:  Normal  Concentration:  Fair  Recall:  FiservFair  Fund of Knowledge:Fair  Language: Fair  Akathisia:  No  Handed:   Right  AIMS (if indicated):   0  Assets:  Desire for Improvement  Sleep:  Number of Hours: 6.75  Cognition: WNL  ADL's:  Intact   Mental Status Per Nursing Assessment::   On Admission:  NA  Demographic Factors:  Male  Loss Factors: NA  Historical Factors: Impulsivity  Risk Reduction Factors:   Employed, Positive social support and Positive therapeutic relationship  Continued Clinical Symptoms:  Previous Psychiatric Diagnoses and Treatments  Cognitive Features That Contribute To Risk:  None    Suicide Risk:  Minimal: No identifiable suicidal ideation.  Patients presenting with no risk factors but with morbid ruminations; may be classified as minimal risk based on the severity of the depressive symptoms  Follow-up Information    Follow up with Dr Barnett ApplebaumParris McKinney.   Why:  Bruce [name] is making you an appointment as she needs to call with a credit card number to secure an appointment   Contact information:   [address], [place] [phone]      Plan Of Care/Follow-up recommendations:  Activity:  regular Diet:  regular Tests:  as needed Other:  follow up with aftercare  Chari Parmenter, MD 05/30/2016, 9:16 AM

## 2016-05-30 NOTE — Progress Notes (Signed)
  [facility] Discharge [facility] :  Will you be returning to the same living situation after discharge:  Yes,  home At discharge, do you have transportation home?: Yes,  family Do you have the ability to pay for your medications: Yes,  insurance  Release of information consent forms completed and in the chart;  Patient's signature needed at discharge.  Patient to Follow up at: Follow-up Information    Follow up with [facility] On 06/04/2016.   Specialty:  Behavioral Health   Why:  Arrive at 8:45 AM on Monday to start the IOP program.   Contact information:   [address] [place] [id number] [phone]      Next level of care provider has access to [facility] Link:yes  Safety Planning and Suicide Prevention discussed: Yes,  yes  Have you used any form of tobacco in the last 30 days? (Cigarettes, Smokeless Tobacco, Cigars, and/or Pipes): No  Has patient been referred to the Quitline?: N/A patient is not a smoker  Patient has been referred for addiction treatment: N/A  Ida Rogueorth, Makina Skow B 05/30/2016, 10:12 AM

## 2016-05-30 NOTE — Discharge Summary (Signed)
Physician Discharge Summary Note  Patient:  Bruce [name] is an 55 y.o., male MRN:  [id number] DOB:  1961-05-17 Patient phone:  [phone] (home)  Patient address:   [address] [place] [address],  Total Time spent with patient: Greater than 30 minutes  Date of Admission:  05/21/2016  Date of Discharge: 05-30-16  Reason for Admission:  Worsening symptoms of depression with psychosis.  Principal Problem: MDD (major depressive disorder), recurrent, severe, with psychosis [facility])  Discharge Diagnoses: Patient Active Problem List   Diagnosis Date Noted  . MDD (major depressive disorder), recurrent, severe, with psychosis (HCC) [F33.3] 05/21/2016  . Low testosterone [E29.1] 05/18/2016  . Depression [F32.9] 04/03/2016  . Constipation [K59.00] 03/22/2016  . Insomnia [G47.00] 03/12/2016  . 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 Hypogonadism, male [E29.1] 02/17/2016  . Fatigue [R53.83] 11/28/2015  . Erectile dysfunction [N52.9] 11/28/2015  . Decreased libido [R68.82] 11/28/2015  . Snoring [R06.83] 11/28/2015  . Generalized anxiety disorder [F41.1] 11/28/2015  . Hypercholesteremia [E78.00]   . Vitamin D deficiency [E55.9]    Past Psychiatric History: Major depression  Past Medical History:  Past Medical History  Diagnosis Date  . Hypercholesteremia   . Vitamin D deficiency   . Depression 04/03/2016    Past Surgical History  Procedure Laterality Date  . Tonsillectomy     Family History:  Family History  Problem Relation Age of Onset  . Heart attack Father   . Hypothyroidism Mother   . Other Neg Hx     hypogonadism   Family Psychiatric  History: See H&P  Social History:  History  Alcohol Use No     History  Drug Use No    Social History   Social History  . Marital Status: Married    Spouse Name: N/A  . Number of Children: 4  . Years of Education: N/A   Occupational History  . engineer    Social History Main Topics  . Smoking  status: Never Smoker   . Smokeless tobacco: None  . Alcohol Use: No  . Drug Use: No  . Sexual Activity: Yes   Other Topics Concern  . None   Social History Narrative   Hospital Course: [name] is a 55 y.o. Male, who is married, employed as an Acupuncturist, who lives in [place], with his wife and 2 kids, has a hx of depression, [facility] accompanied by his wife [name] [phone], for worsening depression and psychosis.  Wife says has been trying for two months to get an appt for patient with a psychiatrist. Patient is oriented x 4 & soft spoken. He maintains fair eye contact. His affect is flat. He is a poor historian as he prefers wife to tell what has been going on. He does answer some questions. It takes him several seconds to respond to some questions. He reports being "fearful" because there is "someone" is the house who is having an affair with his wife. Prajwal reports he can hear someone moving throughout the house. He reports he doesn't feel safe in any room. Pt denies SI and HI. He endorses insomnia, irritability and loss of interest in usual pleasures. Wife Provides collateral info. She is very tearful. Wife reports patient had a "depressive episode" this Jan but didn't want to take medications. She says he tried Wellbutrin for 10 days but became convinced he had a bowel obstruction, even though MD assured him there was no obstruction.   Upon his [facility] & [facility], [name] was evaluated & his presenting symptoms identified. The medication regimen for the presenting symptoms were discussed & initiated targeting those symptoms. He was enrolled in the group counseling sessions & encouraged to participate in the unit programming. His other pre-existing medical problems were identified & his home medications restarted accordingly. During the course of his treatment, [name] was evaluated on daily basis by the clinical providers to assure his  response to the treatment regimen.As his treatment progressed,  improvement was gradually noted as evidenced by his report of decreasing symptoms, improved  mood, medication tolerance & active participation in the unit programming.However, he continued to present with flat affect & his quiet mannerism throughout his hospital stay.  He was encouraged to update his providers on his progress by daily completion of a self inventory reports, noting mood, mental status, any new symptoms, anxiety and or concerns.  [name]'s symptoms gradually responded well to his treatment regimen combined with a therapeutic and supportive environment. He was motivated for recovery as evidenced by a positive/appropriate behavior in the unit. But his interaction with the staff & fellow patients seem very limited.He also worked closely with the treatment team and case manager to develop a discharge plan with appropriate goals to maintain mood stability after discharge. His wife was also involved in his care & discharge plans.   Upon his discharge, [name] was in much improved condition than upon admission.His symptoms were reported as significantly decreased or resolved completely. He adamantly denies any SIHI,  AVH, delusional thoughts & or paranoia. He was motivated to continue taking medication with a goal of continued improvement in mental health. He will continue psychiatric care on an outpatient basis as noted below. He is provided with all the necessary information required to make this appointment without problems. He is discharged on; Olanzapine Zydis disintegrating tablets 5 mg for mood control, Rozerem 8 mg for insomnia & Sertraline 100 mg for depression. He received other medication regimen for the other medical issues presented. He tolerated his treatment regimen without any adverse effects reported. [name] all personal belongings in no apparent distress. Transportation per family.[name].  Physical Findings: AIMS:  Facial and Oral Movements Muscles of Facial Expression: None, normal Lips and Perioral Area: None, normal Jaw: None, normal Tongue: None, normal,Extremity Movements Upper (arms, wrists, hands, fingers): None, normal Lower (legs, knees, ankles, toes): None, normal, Trunk Movements Neck, shoulders, hips: None, normal, Overall Severity Severity of abnormal movements (highest score from questions above): None, normal Incapacitation due to abnormal movements: None, normal Patient's awareness of abnormal movements (rate only patient's report): No Awareness, Dental Status Current problems with teeth and/or dentures?: No Does patient usually wear dentures?: No  CIWA:    COWS:     Musculoskeletal: Strength & Muscle Tone: within normal limits Gait & Station: normal Patient leans: N/A  Psychiatric Specialty Exam: Physical Exam  Constitutional: He appears well-developed.  HENT:  Head: Normocephalic.  Eyes: Pupils are equal, round, and reactive to light.  Neck: Normal range of motion.  Cardiovascular: Normal rate.   Respiratory: Effort normal.  GI: Soft.  Genitourinary:  Denies any issues in this area  Musculoskeletal: Normal range of motion.  Neurological: He is alert.  Skin: Skin is warm.    Review of Systems  Constitutional: Negative.   HENT: Negative.   Eyes: Negative.   Respiratory: Negative.   Cardiovascular: Negative.   Gastrointestinal: Negative.   Genitourinary: Negative.   Musculoskeletal: Negative.   Skin: Negative.  Neurological: Negative.   Endo/Heme/Allergies: Negative.   Psychiatric/Behavioral: Positive for depression (Stable) and hallucinations (Hx. Paranoia). Negative for suicidal ideas, memory loss and substance abuse. The patient has insomnia (Stable). The patient is not nervous/anxious.     Blood pressure 105/75, pulse 82, temperature 97.7 F (36.5 C), temperature source Oral, resp. rate 16, height 5\' 11"  (1.803 m), weight 69.4 kg (153 lb), SpO2 100 %.Body  mass index is 21.35 kg/(m^2).  See Md's SRA   Have you used any form of tobacco in the last 30 days? (Cigarettes, Smokeless Tobacco, Cigars, and/or Pipes): No  Has this patient used any form of tobacco in the last 30 days? (Cigarettes, Smokeless Tobacco, Cigars, and/or Pipes): No  Blood Alcohol level:  Lab Results  Component Value Date   [facility] <5 05/19/2016   ETH <5 05/17/2016   Metabolic Disorder Labs:  Lab Results  Component Value Date   HGBA1C 5.4 05/23/2016   MPG 108 05/23/2016   MPG 111 01/27/2016   Lab Results  Component Value Date   PROLACTIN 7.4 02/17/2016   Lab Results  Component Value Date   CHOL 180 05/23/2016   TRIG 131 05/23/2016   HDL 38* 05/23/2016   CHOLHDL 4.7 05/23/2016   VLDL 26 05/23/2016   LDLCALC 116* 05/23/2016   LDLCALC 84 11/23/2011   See Psychiatric Specialty Exam and Suicide Risk Assessment completed by Attending Physician prior to discharge.  Discharge destination:  Home  Is patient on multiple antipsychotic therapies at discharge:  No   Has Patient had three or more failed trials of antipsychotic monotherapy by history:  No  Recommended Plan for Multiple Antipsychotic Therapies: NA    Medication List    STOP taking these medications        ALPRAZolam 0.5 MG tablet  Commonly known as:  XANAX     clomiPHENE 50 MG tablet  Commonly known as:  CLOMID      TAKE these medications      Indication   cholecalciferol 1000 units tablet  Commonly known as:  VITAMIN D  Take 1 tablet (1,000 Units total) by mouth daily. For bone health   Indication:  Bone health     cyanocobalamin 100 MCG tablet  Take 1 tablet (100 mcg total) by mouth daily. For B-12 replacement   Indication:  Inadequate Vitamin B12     OLANZapine zydis 5 MG disintegrating tablet  Commonly known as:  ZYPREXA  Take 1 tablet (5 mg total) by mouth at bedtime. For mood control   Indication:  Mood control     ramelteon 8 MG tablet  Commonly known as:  ROZEREM  Take 1  tablet (8 mg total) by mouth at bedtime. For sleep   Indication:  Trouble Sleeping     sertraline 100 MG tablet  Commonly known as:  ZOLOFT  Take 1 tablet (100 mg total) by mouth daily. For depression   Indication:  Major Depressive Disorder     sildenafil 20 MG tablet  Commonly known as:  REVATIO  1-5 pills, as needed for ED symptoms   Indication:  Erectile dysfunction       Follow-up Information    Follow up with [facility] On 06/04/2016.   Specialty:  Behavioral Health   Why:  Arrive at 8:45 AM on Monday to start the IOP program.   Contact information:   [address] [place] [address] [phone]    Follow-up recommendations: Activity:  As tolerated Diet: As recommended  by your primary care doctor. Keep all scheduled follow-up appointments as recommended.    Comments: Patient is instructed prior to discharge to: Take all medications as prescribed by his/her mental healthcare provider. Report any adverse effects and or reactions from the medicines to his/her outpatient provider promptly. Patient has been instructed & cautioned: To not engage in alcohol and or illegal drug use while on prescription medicines. In the event of worsening symptoms, patient is instructed to call the crisis hotline, 911 and or go to the nearest ED for appropriate evaluation and treatment of symptoms. To follow-up with his/her primary care provider for your other medical issues, concerns and or health care needs.   Signed: Sanjuana Kava, NP, PMHNP, FNP-BC 05/31/2016, 3:50 PM

## 2016-05-31 ENCOUNTER — Telehealth: Payer: Self-pay | Admitting: Family Medicine

## 2016-05-31 NOTE — Telephone Encounter (Signed)
Pt's spouse Vickey HugerLana - [phone] called in because she says that pt was prescribed ROZEREM at the hospital. Pt says that hospital and pharmacy informed her that PCP will have to authorize medication to okay pt to take it. Pt need medication to sleep.    Pt request message be sent as urgent.

## 2016-06-01 ENCOUNTER — Encounter: Payer: Self-pay | Admitting: *Deleted

## 2016-06-01 NOTE — Telephone Encounter (Signed)
PA for Rozerem has been denied. Covered alternatives are eszopiclone and zaleplon. Please advise. JG//CMA

## 2016-06-01 NOTE — Telephone Encounter (Signed)
PA initiated on [email or web address], awaiting determination. JG//CMA 

## 2016-06-01 NOTE — Telephone Encounter (Signed)
Spoke with pt's wife, Bruce [name], on the phone. She was inquiring about previous medications tried for insomnia. She states she can only view current medications on MyChart and not d/c'd medications. I researched pt's chart and forwarded her all medications that have been tried for insomnia via MyChart. Called and informed Bruce [name] that information was sent via MyChart and she was very thankful. JG//CMA

## 2016-06-03 NOTE — Telephone Encounter (Signed)
Ins will not pay for rozeram Sonata 5mg  1 po qhs prn # 30  Notify pt --- ideally not good to have to take every night.  Was psych f/u appointment made-? They can adjust or change if needed.

## 2016-06-04 MED ORDER — ZALEPLON 5 MG PO CAPS: 5.0000 mg | ORAL_CAPSULE | Freq: Every evening | ORAL | Status: AC | PRN

## 2016-06-04 NOTE — Telephone Encounter (Signed)
Sonata 5mg  e-scribed to pharmacy.  Called pt and didn't receive answer, will try back later today. JG//CMA

## 2016-06-04 NOTE — Telephone Encounter (Signed)
Spoke with pt's wife, Vickey HugerLana, and verbalized understanding. JG//CMA

## 2016-06-05 ENCOUNTER — Telehealth (HOSPITAL_COMMUNITY): Payer: Self-pay | Admitting: Psychiatry

## 2016-06-14 ENCOUNTER — Telehealth: Payer: Self-pay | Admitting: Endocrinology

## 2016-06-14 NOTE — Telephone Encounter (Deleted)
Patient need a refill of medication, of sildenafil (REVATIO) 20 MG tablet send to,  [facility] [place] - [address] [phone] (Phone) [phone] (Fax)       disregard this message patient found the script

## 2016-06-14 NOTE — Telephone Encounter (Signed)
Elease HashimotoPatricia, I did not see where a noted had been attached to this pt.

## 2016-06-14 NOTE — Telephone Encounter (Signed)
Patient wife called for a prescription, and called back and stated that husband found script in car, ask to disregard message

## 2016-07-21 IMAGING — MR MR HEAD WO/W CM
13 of 19 series · 29 of 48 positions shown · IV contrast (multihance)
Comparison: none

CLINICAL DATA: Pituitary insufficiency.  Low testosterone level.

EXAM:
MRI HEAD WITHOUT AND WITH CONTRAST
TECHNIQUE: Multiplanar, multiecho pulse sequences of the brain and surrounding
structures were obtained without and with intravenous contrast.
CONTRAST:  8 mL MultiHance IV

[Series 2: T1 · sagittal · 5.0mm · 0.98mm/px · 3 of 27 slices shown (1 of 2)]
[im 1/27]
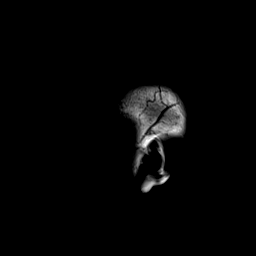
[im 14/27]
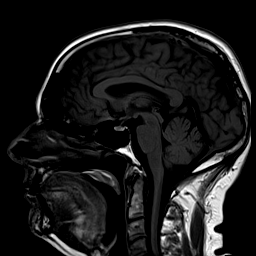
[im 27/27]
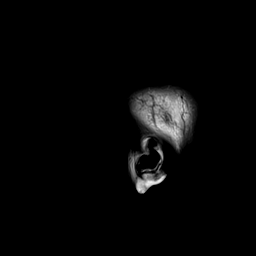

[Series 3: DWI · axial · 5.0mm · 1.80mm/px · z∈[-43,+106]mm · 6 of 48 slices shown]
[im 1/48]
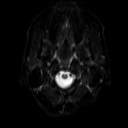
[im 10/48]
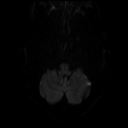
[im 19/48]
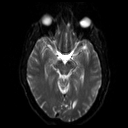
[im 29/48]
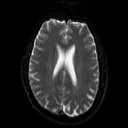
[im 38/48]
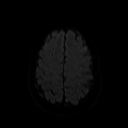
[im 48/48]
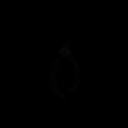

[Series 5: T2 · axial · 5.0mm · 0.38mm/px · z∈[-42,+112]mm · 3 of 25 slices shown]
[im 1/25]
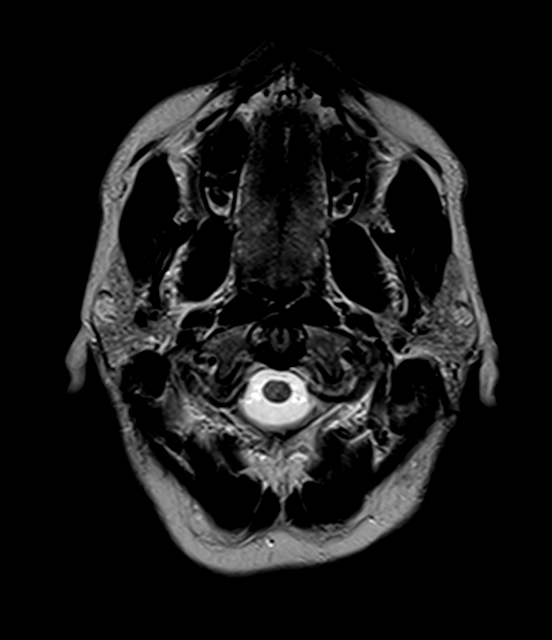
[im 13/25]
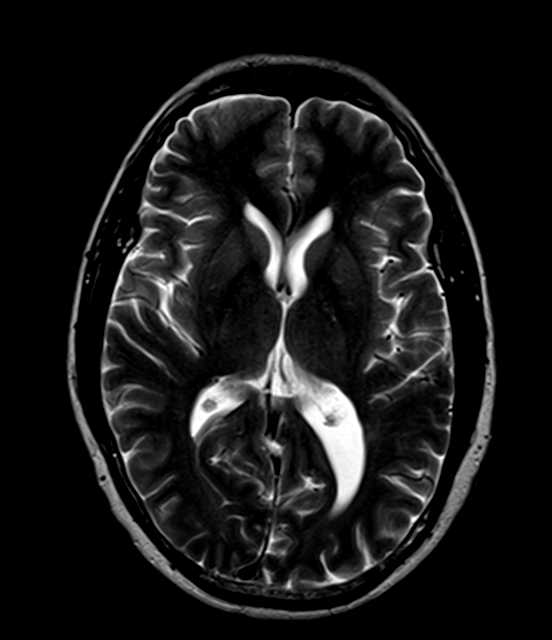
[im 25/25]
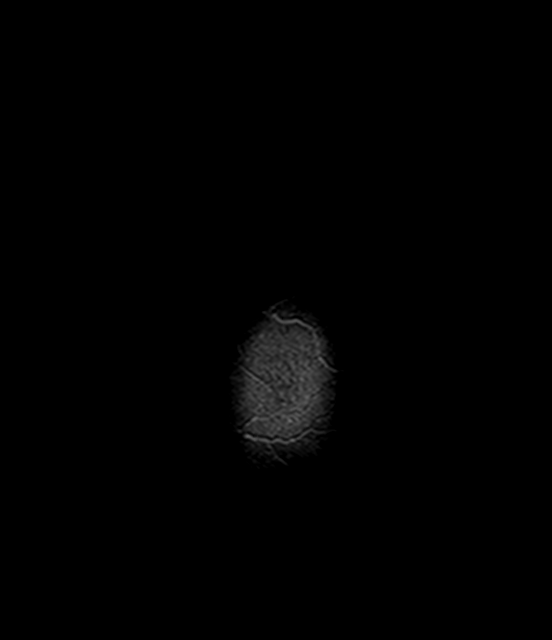

[Series 6: FLAIR · axial · 5.0mm · 0.45mm/px · z∈[-47,+108]mm · 3 of 25 slices shown]
[im 1/25]
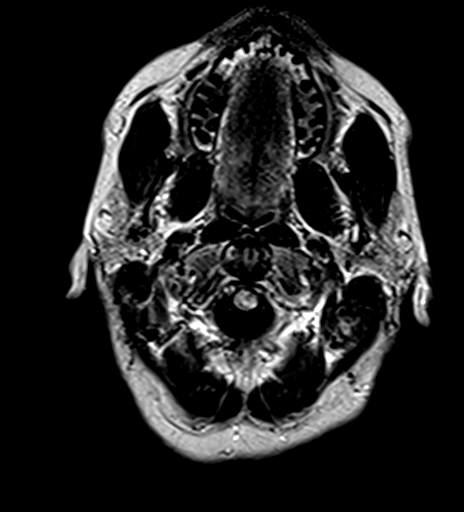
[im 13/25]
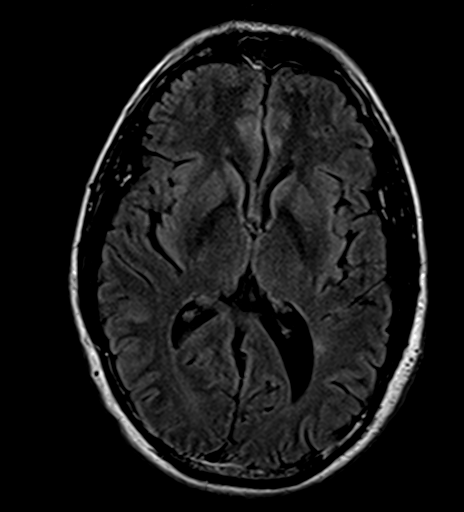
[im 25/25]
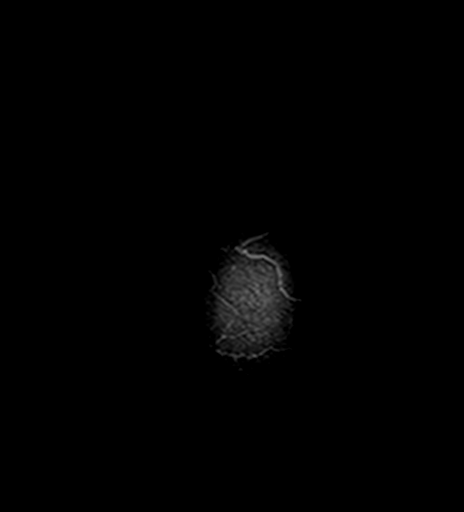

[Series 8: T1 · sagittal · 5.0mm · 0.98mm/px · 2 of 28 slices shown (2 of 2)]
[im 1/28]
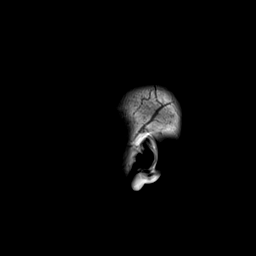
[im 14/28]
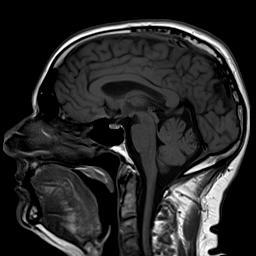

[Series 12: T1 post-contrast · coronal · 3.0mm · 0.35mm/px · 1 of 7 slices shown (1 of 8)]
[im 1/7]
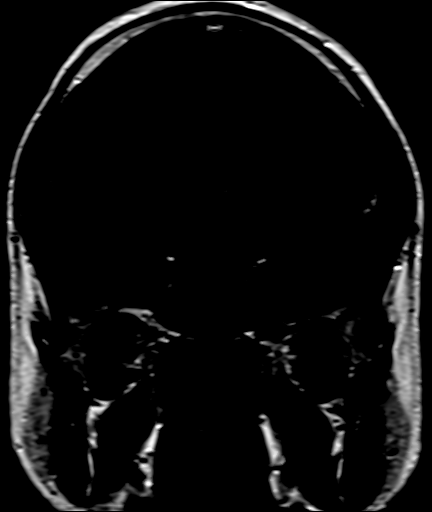

[Series 13: T1 post-contrast · coronal · 3.0mm · 0.35mm/px · 1 of 7 slices shown (2 of 8)]
[im 1/7]
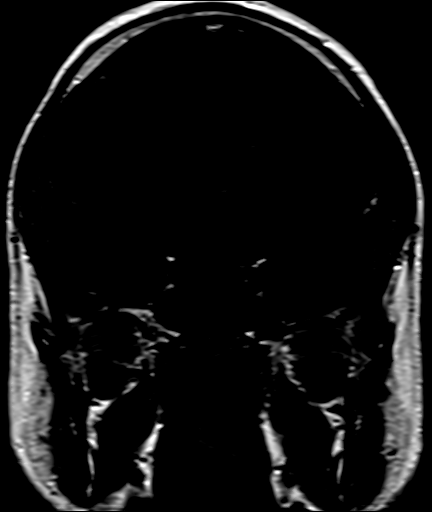

[Series 14: T1 post-contrast · coronal · 3.0mm · 0.35mm/px · 1 of 7 slices shown (3 of 8)]
[im 1/7]
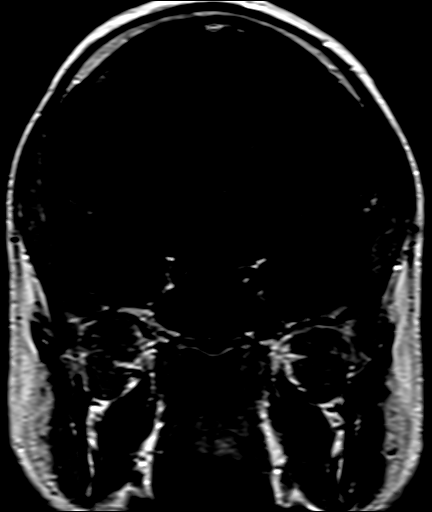

[Series 15: T1 post-contrast · coronal · 3.0mm · 0.35mm/px · 1 of 7 slices shown (4 of 8)]
[im 1/7]
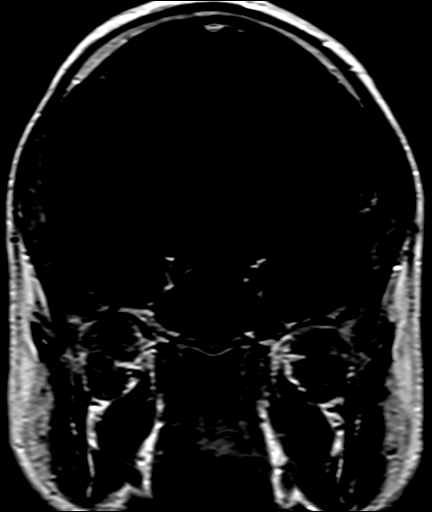

[Series 16: T1 post-contrast · coronal · 3.0mm · 0.35mm/px · 1 of 7 slices shown (5 of 8)]
[im 1/7]
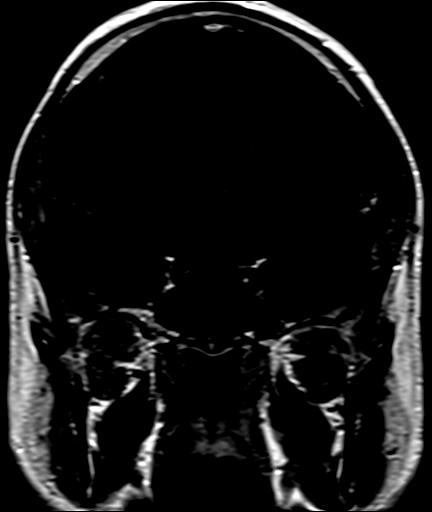

[Series 17: T1 post-contrast · sagittal · 3.0mm · 0.33mm/px · 2 of 13 slices shown (6 of 8)]
[im 1/13]
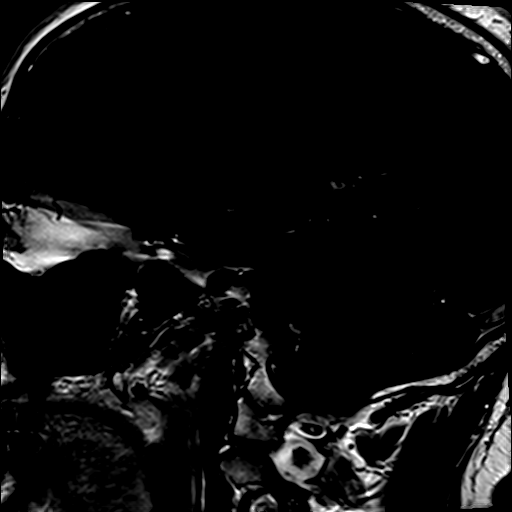
[im 13/13]
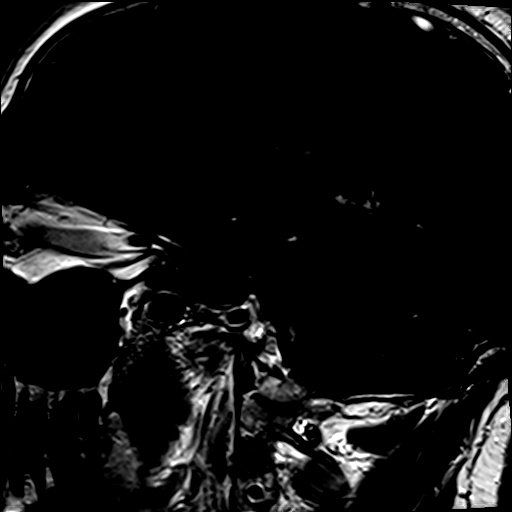

[Series 18: T1 post-contrast · coronal · 3.0mm · 0.33mm/px · 1 of 11 slices shown (7 of 8)]
[im 1/11]
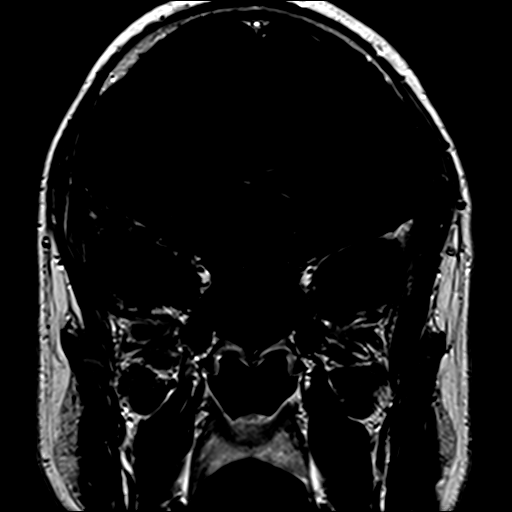

[Series 20: T1 post-contrast · coronal · 5.0mm · 0.45mm/px · 4 of 32 slices shown (8 of 8)]
[im 1/32]
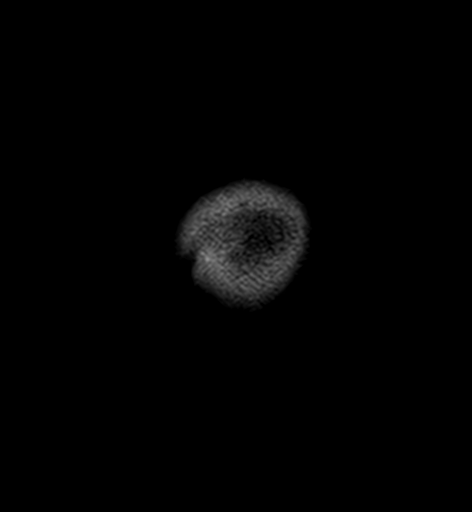
[im 11/32]
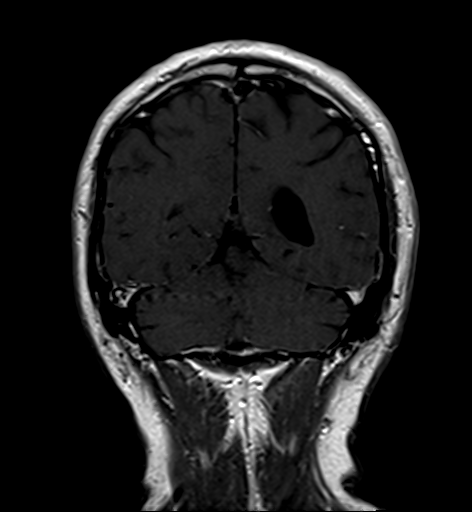
[im 21/32]
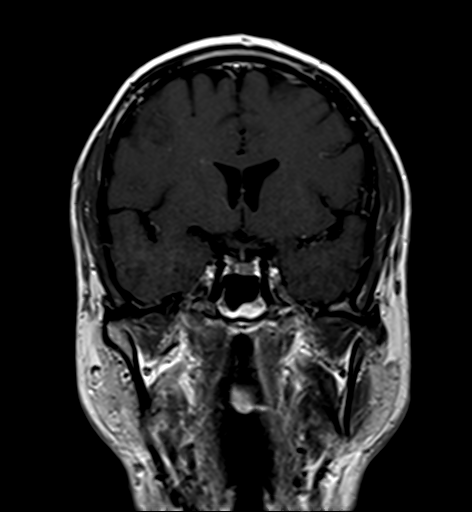
[im 32/32]
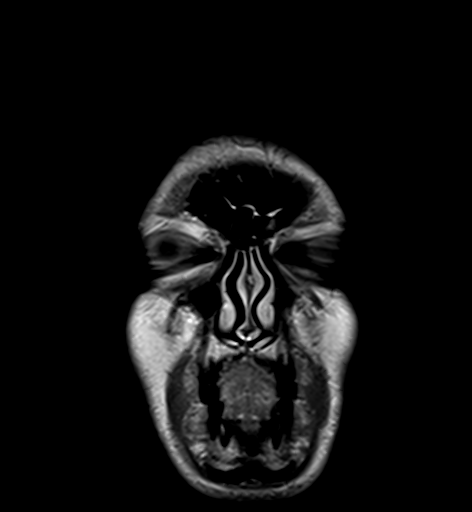

[29 of 48 positions shown; findings below may reference images not displayed]

FINDINGS: Dynamic pituitary protocol with thin sections through the sella.

Pituitary normal in size. 2 mm cyst posterior pituitary. Anterior
lobe of the pituitary enhances homogeneously without microadenoma.
Infundibulum midline. No compression optic chiasm. Cavernous sinus
normal.

Ventricle size normal.  Cerebral volume normal.

Negative for acute or chronic ischemia. Negative for demyelinating
disease.

Negative for intracranial hemorrhage. No intracranial mass or edema.

Postcontrast imaging of the entire brain reveals normal enhancement.
No enhancing mass lesion. Normal vascular enhancement.

Mild mucosal edema in the paranasal sinuses. No air-fluid level.
Skullbase intact.
IMPRESSION: 2 mm cyst in the posterior pituitary. Remainder of the pituitary
normal.

## 2016-08-02 ENCOUNTER — Other Ambulatory Visit: Payer: BLUE CROSS/BLUE SHIELD

## 2016-08-16 IMAGING — DX DG ABDOMEN 2V
2 series · 2 of 2 positions shown · non-contrast
Comparison: None

CLINICAL DATA: Slow transit constipation

EXAM:
ABDOMEN - 2 VIEW

[abdomen erect]
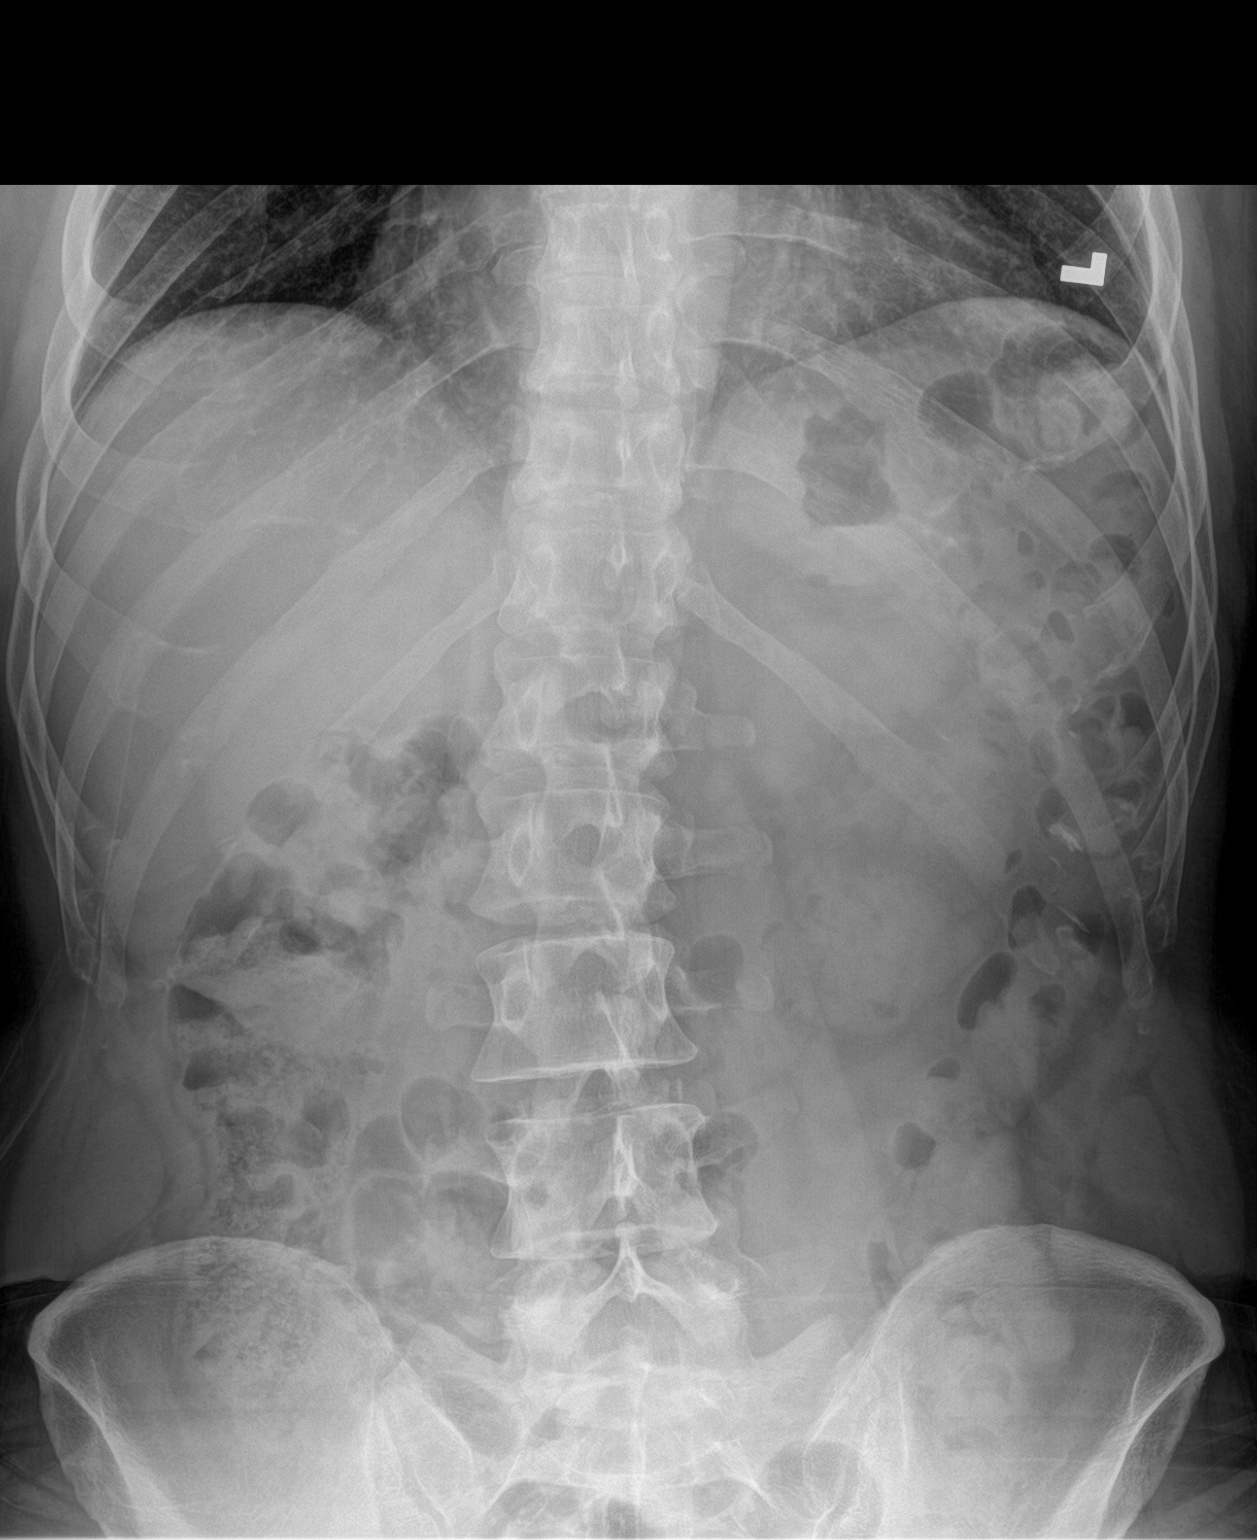

[abdomen supine]
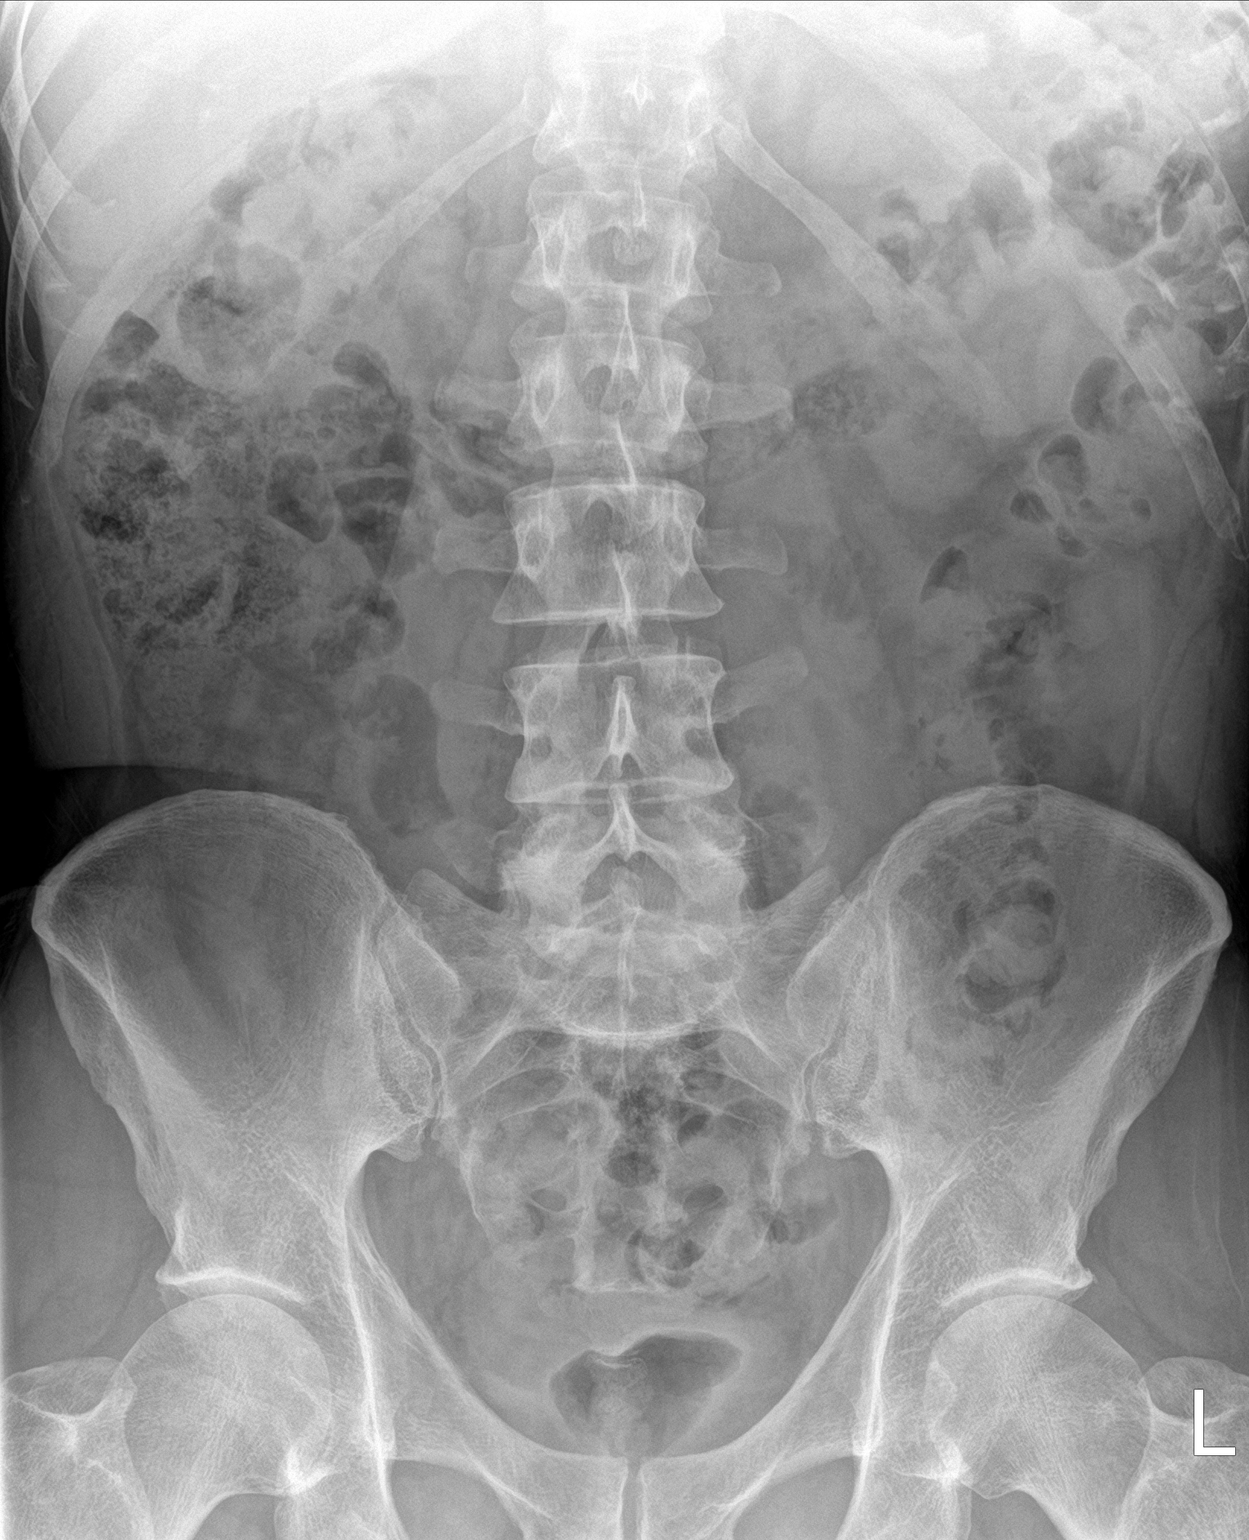

[2 of 2 positions shown; findings below may reference images not displayed]

FINDINGS: Lung bases clear.

Cardiac silhouette appears enlarged.

Minimally prominent stool RIGHT colon.

Bowel gas pattern otherwise normal.

No bowel dilatation or evidence of obstruction.

Bones demineralized.

No urine tract calcification.
IMPRESSION: Minimally prominent stool RIGHT colon.

Otherwise normal exam.

## 2017-05-03 ENCOUNTER — Ambulatory Visit: Payer: BLUE CROSS/BLUE SHIELD | Admitting: Endocrinology

## 2017-06-28 DIAGNOSIS — M722 Plantar fascial fibromatosis: Secondary | ICD-10-CM | POA: Diagnosis not present

## 2017-06-28 DIAGNOSIS — M71571 Other bursitis, not elsewhere classified, right ankle and foot: Secondary | ICD-10-CM | POA: Diagnosis not present

## 2017-06-28 DIAGNOSIS — M7731 Calcaneal spur, right foot: Secondary | ICD-10-CM | POA: Diagnosis not present

## 2017-06-28 DIAGNOSIS — M76829 Posterior tibial tendinitis, unspecified leg: Secondary | ICD-10-CM | POA: Diagnosis not present

## 2017-07-01 DIAGNOSIS — M6281 Muscle weakness (generalized): Secondary | ICD-10-CM | POA: Diagnosis not present

## 2017-07-01 DIAGNOSIS — M25541 Pain in joints of right hand: Secondary | ICD-10-CM | POA: Diagnosis not present

## 2017-12-13 DIAGNOSIS — M545 Low back pain: Secondary | ICD-10-CM | POA: Diagnosis not present

## 2017-12-13 DIAGNOSIS — M62838 Other muscle spasm: Secondary | ICD-10-CM | POA: Diagnosis not present

## 2021-03-17 ENCOUNTER — Other Ambulatory Visit: Payer: Self-pay | Admitting: Family Medicine

## 2021-03-17 DIAGNOSIS — E782 Mixed hyperlipidemia: Secondary | ICD-10-CM

## 2021-05-05 ENCOUNTER — Other Ambulatory Visit: Payer: BLUE CROSS/BLUE SHIELD

## 2021-05-19 ENCOUNTER — Other Ambulatory Visit: Payer: BLUE CROSS/BLUE SHIELD

## 2021-06-02 ENCOUNTER — Other Ambulatory Visit: Payer: BLUE CROSS/BLUE SHIELD

## 2021-06-02 ENCOUNTER — Ambulatory Visit
Admission: RE | Admit: 2021-06-02 | Discharge: 2021-06-02 | Disposition: A | Discharge status: Home / Self Care | Payer: No Typology Code available for payment source | Source: Ambulatory Visit | Attending: Family Medicine | Admitting: Family Medicine

## 2021-06-02 DIAGNOSIS — E782 Mixed hyperlipidemia: Secondary | ICD-10-CM

## 2021-07-28 ENCOUNTER — Other Ambulatory Visit: Payer: BLUE CROSS/BLUE SHIELD
